# Patient Record
Sex: Male | Born: 1965 | Race: White | Marital: Married | Smoking: Former smoker
Health system: Southern US, Community
[De-identification: ages and names within clinical notes are randomized; demographics above are authoritative.]

## PROBLEM LIST (undated history)

## (undated) DIAGNOSIS — I10 Essential (primary) hypertension: Secondary | ICD-10-CM

## (undated) DIAGNOSIS — E782 Mixed hyperlipidemia: Secondary | ICD-10-CM

## (undated) DIAGNOSIS — T8859XA Other complications of anesthesia, initial encounter: Secondary | ICD-10-CM

## (undated) DIAGNOSIS — R112 Nausea with vomiting, unspecified: Secondary | ICD-10-CM

## (undated) DIAGNOSIS — M792 Neuralgia and neuritis, unspecified: Secondary | ICD-10-CM

## (undated) DIAGNOSIS — E785 Hyperlipidemia, unspecified: Secondary | ICD-10-CM

## (undated) DIAGNOSIS — Z9889 Other specified postprocedural states: Secondary | ICD-10-CM

## (undated) HISTORY — DX: Essential (primary) hypertension: I10

## (undated) HISTORY — DX: Hyperlipidemia, unspecified: E78.5

## (undated) HISTORY — PX: COLONOSCOPY: SHX174

## (undated) HISTORY — DX: Neuralgia and neuritis, unspecified: M79.2

## (undated) HISTORY — DX: Mixed hyperlipidemia: E78.2

## (undated) HISTORY — PX: OTHER SURGICAL HISTORY: SHX169

---

## 1992-01-23 HISTORY — PX: NOSE SURGERY: SHX723

## 2013-10-22 DIAGNOSIS — N5201 Erectile dysfunction due to arterial insufficiency: Secondary | ICD-10-CM | POA: Insufficient documentation

## 2013-10-22 DIAGNOSIS — Z8042 Family history of malignant neoplasm of prostate: Secondary | ICD-10-CM | POA: Insufficient documentation

## 2013-10-23 DIAGNOSIS — I491 Atrial premature depolarization: Secondary | ICD-10-CM | POA: Insufficient documentation

## 2013-10-23 DIAGNOSIS — E7801 Familial hypercholesterolemia: Secondary | ICD-10-CM | POA: Insufficient documentation

## 2016-10-30 ENCOUNTER — Telehealth: Payer: Self-pay | Admitting: *Deleted

## 2016-10-30 NOTE — Telephone Encounter (Signed)
Received Medical records from [facility] for New Patient appt on 11/05/16; forwarded to provider/SLS 10/09

## 2016-11-05 ENCOUNTER — Ambulatory Visit: Payer: Self-pay | Admitting: Medical

## 2016-11-21 ENCOUNTER — Telehealth: Payer: Self-pay

## 2016-11-21 NOTE — Telephone Encounter (Signed)
Pre visit call completed 

## 2016-11-22 ENCOUNTER — Ambulatory Visit (INDEPENDENT_AMBULATORY_CARE_PROVIDER_SITE_OTHER): Payer: PRIVATE HEALTH INSURANCE | Admitting: Medical

## 2016-11-22 ENCOUNTER — Encounter: Payer: Self-pay | Admitting: Medical

## 2016-11-22 VITALS — BP 138/98 | HR 73 | Temp 98.2°F | Resp 16 | Ht 73.0 in | Wt 278.0 lb

## 2016-11-22 DIAGNOSIS — Z125 Encounter for screening for malignant neoplasm of prostate: Secondary | ICD-10-CM | POA: Diagnosis not present

## 2016-11-22 DIAGNOSIS — E785 Hyperlipidemia, unspecified: Secondary | ICD-10-CM

## 2016-11-22 DIAGNOSIS — I10 Essential (primary) hypertension: Secondary | ICD-10-CM

## 2016-11-22 LAB — COMPREHENSIVE METABOLIC PANEL
ALBUMIN: 4.6 g/dL (ref 3.5–5.2)
ALK PHOS: 53 U/L (ref 39–117)
ALT: 42 U/L (ref 0–53)
AST: 32 U/L (ref 0–37)
BUN: 22 mg/dL (ref 6–23)
CO2: 31 mEq/L (ref 19–32)
CREATININE: 0.91 mg/dL (ref 0.40–1.50)
Calcium: 9.5 mg/dL (ref 8.4–10.5)
Chloride: 100 mEq/L (ref 96–112)
GFR: 92.36 mL/min (ref >60.00)
GLUCOSE: 95 mg/dL (ref 70–99)
POTASSIUM: 4 meq/L (ref 3.5–5.1)
SODIUM: 138 meq/L (ref 135–145)
TOTAL PROTEIN: 6.7 g/dL (ref 6.0–8.3)
Total Bilirubin: 0.8 mg/dL (ref 0.2–1.2)

## 2016-11-22 LAB — LIPID PANEL
CHOLESTEROL: 251 mg/dL — AB (ref 0–200)
HDL: 37.4 mg/dL — ABNORMAL LOW (ref >39.00)
LDL Cholesterol: 185 mg/dL — ABNORMAL HIGH (ref 0–99)
NONHDL: 213.5
Total CHOL/HDL Ratio: 7
Triglycerides: 141 mg/dL (ref 0.0–149.0)
VLDL: 28.2 mg/dL (ref 0.0–40.0)

## 2016-11-22 LAB — CBC
HCT: 46.6 % (ref 39.0–52.0)
Hemoglobin: 16.1 g/dL (ref 13.0–17.0)
MCHC: 34.5 g/dL (ref 30.0–36.0)
MCV: 88.7 fl (ref 78.0–100.0)
Platelets: 236 10*3/uL (ref 150.0–400.0)
RBC: 5.25 Mil/uL (ref 4.22–5.81)
RDW: 12.8 % (ref 11.5–15.5)
WBC: 7 10*3/uL (ref 4.0–10.5)

## 2016-11-22 LAB — PSA: PSA: 2.24 ng/mL (ref 0.10–4.00)

## 2016-11-22 MED ORDER — HYDROCHLOROTHIAZIDE 25 MG PO TABS: 25.0000 mg | ORAL_TABLET | Freq: Every day | ORAL | 0 refills | Status: DC

## 2016-11-22 MED ORDER — LOSARTAN POTASSIUM 50 MG PO TABS: 50.0000 mg | ORAL_TABLET | Freq: Every day | ORAL | 0 refills | Status: DC

## 2016-11-22 NOTE — Patient Instructions (Addendum)
Your blood pressure is borderline high today.  Also at home your report some borderline BP readings as well.  I want you to continue your HCTZ at current dose.  But I am also going to add low-dose losartan to make sure BP is more tightly controlled.  I would continue to check your blood pressure at home 3 times a week.  Would like you to run closer to 130/80 on average.  For high cholesterol history we will repeat lipid panel today.  Also will get metabolic panel and CBC.  Will get screening PSA today due to your family history.  Follow-up in 3 months or as needed.  Flu vaccine at pharmacy

## 2016-11-22 NOTE — Progress Notes (Signed)
Subjective:    Patient ID: Tim Welch, male    DOB: 13-Apr-1965, 51 y.o.   MRN: [id number]  HPI  Pt in for new pt appointment.   Pt bp is borderline is mild high today and most of his readings are borderline. He is only on diuretic. Pt states he was on benicar in past and thought he was about to pass out. He stopped that but he never checked his bp that time. But during that time in past he had lost 20 lbs.  Pt has some ED and he uses revatio.  Working out 3 days a week now, self employed, coffee 2 cups a day, Some tea, low carb diet recently, avoids red meat and pork. Alcohol 3-5 ounces vodka Friday and Saturday.  Last colonoscopy 2017.  Last tetanus 2017-2018.   Dad prostate cancer at age 50 yo.    Review of Systems  Constitutional: Negative for chills, fatigue and fever.  HENT: Negative for congestion, ear discharge, facial swelling, postnasal drip and rhinorrhea.   Respiratory: Negative for cough, chest tightness, shortness of breath and wheezing.   Cardiovascular: Negative for chest pain and palpitations.  Gastrointestinal: Negative for abdominal pain, blood in stool, nausea and vomiting.  Musculoskeletal: Negative for back pain, gait problem and joint swelling.  Skin: Negative for rash.  Neurological: Negative for dizziness, seizures, speech difficulty, weakness, numbness and headaches.  Hematological: Negative for adenopathy. Does not bruise/bleed easily.  Psychiatric/Behavioral: Negative for behavioral problems, confusion, self-injury and sleep disturbance. The patient is not nervous/anxious.    Past Medical History:  Diagnosis Date  . Hyperlipidemia   . Hypertension      Social History   Social History  . Marital status: Married    Spouse name: N/A  . Number of children: N/A  . Years of education: N/A   Occupational History  . Not on file.   Social History Main Topics  . Smoking status: Former Games developer  . Smokeless tobacco: Never Used  . Alcohol use Yes  .  Drug use: Unknown  . Sexual activity: Not on file   Other Topics Concern  . Not on file   Social History Narrative  . No narrative on file    Past Surgical History:  Procedure Laterality Date  . tosillectomy      Family History  Problem Relation Age of Onset  . Cancer Mother   . Hypertension Father   . Hyperlipidemia Father   . Cancer Father     Allergies  Allergen Reactions  . Penicillins Hives    Current Outpatient Prescriptions on File Prior to Visit  Medication Sig Dispense Refill  . hydrochlorothiazide (HYDRODIURIL) 25 MG tablet Take 25 mg by mouth daily.    . sildenafil (REVATIO) 20 MG tablet Take 20 mg by mouth 3 (three) times daily.     No current facility-administered medications on file prior to visit.     Pulse 73   Temp 98.2 F (36.8 C) (Oral)   Resp 16   Ht 6\' 1"  (1.854 m)   Wt 278 lb (126.1 kg)   SpO2 98%   BMI 36.68 kg/m       Objective:   Physical Exam  General Mental Status- Alert. General Appearance- Not in acute distress.   Skin General: Color- Normal Color. Moisture- Normal Moisture.  Neck Carotid Arteries- Normal color. Moisture- Normal Moisture. No carotid bruits. No JVD.  Chest and Lung Exam Auscultation: Breath Sounds:-Normal.  Cardiovascular Auscultation:Rythm- Regular. Murmurs & Other Heart Sounds:Auscultation  of the heart reveals- No Murmurs.  Abdomen Inspection:-Inspeection Normal. Palpation/Percussion:Note:No mass. Palpation and Percussion of the abdomen reveal- Non Tender, Non Distended + BS, no rebound or guarding.   Neurologic Cranial Nerve exam:- CN III-XII intact(No nystagmus), symmetric smile. Normal/IntactStrength:- 5/5 equal and symmetric strength both upper and lower extremities.      Assessment & Plan:  Your blood pressure is borderline high today.  Also at home your report some borderline BP readings as well.  I want you to continue your HCTZ at current dose.  But I am also going to add low-dose  losartan to make sure BP is more tightly controlled.  I would continue to check your blood pressure at home 3 times a week.  Would like you to run closer to 130/80 on average.  For high cholesterol history we will repeat lipid panel today.  Also will get metabolic panel and CBC.  Will get screening PSA today due to your family history.  Follow-up in 3 months or as needed.  Coumba Kellison, Ramon DredgeEdward, PA-C

## 2016-11-23 ENCOUNTER — Telehealth: Payer: Self-pay | Admitting: Medical

## 2016-11-23 ENCOUNTER — Ambulatory Visit: Payer: Self-pay | Admitting: Medical

## 2016-11-23 MED ORDER — ATORVASTATIN CALCIUM 10 MG PO TABS: 10.0000 mg | ORAL_TABLET | Freq: Every day | ORAL | 2 refills | Status: DC

## 2016-11-23 NOTE — Telephone Encounter (Signed)
Prescription of atorvastatin sent to the pharmacy.

## 2017-01-28 ENCOUNTER — Other Ambulatory Visit: Payer: Self-pay | Admitting: Medical

## 2017-03-06 ENCOUNTER — Other Ambulatory Visit: Payer: Self-pay | Admitting: Medical

## 2017-03-06 NOTE — Telephone Encounter (Signed)
Called pt and LVM advising to call and schedule a follow up appt for further refills.

## 2017-03-06 NOTE — Telephone Encounter (Signed)
Pt due for follow up please call and schedule appointment.  

## 2017-06-25 ENCOUNTER — Ambulatory Visit (INDEPENDENT_AMBULATORY_CARE_PROVIDER_SITE_OTHER): Payer: PRIVATE HEALTH INSURANCE | Admitting: Medical

## 2017-06-25 ENCOUNTER — Telehealth: Payer: Self-pay | Admitting: Medical

## 2017-06-25 ENCOUNTER — Encounter: Payer: Self-pay | Admitting: Medical

## 2017-06-25 ENCOUNTER — Telehealth: Payer: Self-pay

## 2017-06-25 VITALS — BP 142/69 | HR 73 | Temp 98.2°F | Resp 16 | Ht 73.0 in | Wt 280.6 lb

## 2017-06-25 DIAGNOSIS — Z23 Encounter for immunization: Secondary | ICD-10-CM

## 2017-06-25 DIAGNOSIS — Z125 Encounter for screening for malignant neoplasm of prostate: Secondary | ICD-10-CM

## 2017-06-25 DIAGNOSIS — Z86010 Personal history of colonic polyps: Secondary | ICD-10-CM

## 2017-06-25 DIAGNOSIS — Z0000 Encounter for general adult medical examination without abnormal findings: Secondary | ICD-10-CM

## 2017-06-25 LAB — COMPREHENSIVE METABOLIC PANEL
ALK PHOS: 59 U/L (ref 39–117)
ALT: 48 U/L (ref 0–53)
AST: 38 U/L — AB (ref 0–37)
Albumin: 4.8 g/dL (ref 3.5–5.2)
BILIRUBIN TOTAL: 0.9 mg/dL (ref 0.2–1.2)
BUN: 21 mg/dL (ref 6–23)
CALCIUM: 9.7 mg/dL (ref 8.4–10.5)
CO2: 29 meq/L (ref 19–32)
CREATININE: 1.07 mg/dL (ref 0.40–1.50)
Chloride: 101 mEq/L (ref 96–112)
GFR: 76.45 mL/min (ref >60.00)
Glucose, Bld: 97 mg/dL (ref 70–99)
Potassium: 4.2 mEq/L (ref 3.5–5.1)
Sodium: 139 mEq/L (ref 135–145)
TOTAL PROTEIN: 6.7 g/dL (ref 6.0–8.3)

## 2017-06-25 LAB — CBC
HCT: 45.7 % (ref 39.0–52.0)
Hemoglobin: 16 g/dL (ref 13.0–17.0)
MCHC: 34.9 g/dL (ref 30.0–36.0)
MCV: 87.6 fl (ref 78.0–100.0)
Platelets: 258 10*3/uL (ref 150.0–400.0)
RBC: 5.22 Mil/uL (ref 4.22–5.81)
RDW: 13.3 % (ref 11.5–15.5)
WBC: 7.6 10*3/uL (ref 4.0–10.5)

## 2017-06-25 LAB — URINALYSIS, ROUTINE W REFLEX MICROSCOPIC
Bilirubin Urine: NEGATIVE
KETONES UR: NEGATIVE
Leukocytes, UA: NEGATIVE
Nitrite: NEGATIVE
PH: 5 (ref 5.0–8.0)
RBC / HPF: NONE SEEN (ref 0–?)
SPECIFIC GRAVITY, URINE: 1.025 (ref 1.000–1.030)
Total Protein, Urine: NEGATIVE
UROBILINOGEN UA: 0.2 (ref 0.0–1.0)
Urine Glucose: NEGATIVE
WBC, UA: NONE SEEN (ref 0–?)

## 2017-06-25 LAB — PSA: PSA: 2.15 ng/mL (ref 0.10–4.00)

## 2017-06-25 LAB — LIPID PANEL
CHOL/HDL RATIO: 6
Cholesterol: 172 mg/dL (ref 0–200)
HDL: 30.7 mg/dL — ABNORMAL LOW (ref >39.00)
LDL Cholesterol: 105 mg/dL — ABNORMAL HIGH (ref 0–99)
NONHDL: 141.42
Triglycerides: 183 mg/dL — ABNORMAL HIGH (ref 0.0–149.0)
VLDL: 36.6 mg/dL (ref 0.0–40.0)

## 2017-06-25 MED ORDER — ATORVASTATIN CALCIUM 10 MG PO TABS: ORAL_TABLET | ORAL | 2 refills | Status: DC

## 2017-06-25 MED ORDER — LOSARTAN POTASSIUM 50 MG PO TABS: 50.0000 mg | ORAL_TABLET | Freq: Every day | ORAL | 0 refills | Status: DC

## 2017-06-25 MED ORDER — CIPROFLOXACIN HCL 500 MG PO TABS: 500.0000 mg | ORAL_TABLET | Freq: Two times a day (BID) | ORAL | 0 refills | Status: DC

## 2017-06-25 MED ORDER — TYPHOID VACCINE PO CPDR: 1.0000 | DELAYED_RELEASE_CAPSULE | ORAL | 0 refills | Status: DC

## 2017-06-25 MED ORDER — SILDENAFIL CITRATE 20 MG PO TABS: ORAL_TABLET | ORAL | 0 refills | Status: AC

## 2017-06-25 MED ORDER — HYDROCHLOROTHIAZIDE 25 MG PO TABS: 25.0000 mg | ORAL_TABLET | Freq: Every day | ORAL | 0 refills | Status: DC

## 2017-06-25 NOTE — Patient Instructions (Addendum)
For you wellness exam today I have ordered cbc, cmp, tsh, lipid panel, and ua. PSA screening as well.  Hep A vaccine today. You can check on MMR and can update [place] as well.([facility] and update [place])  Recommend exercise and healthy diet.  We will let you know lab results as they come in.  Follow up date appointment will be determined after lab review.     Vivotif vaccine and cipro for [place] trip. Take as advised.  Preventive Care 40-64 Years, Male Preventive care refers to lifestyle choices and visits with your health care provider that can promote health and wellness. What does preventive care include?  A yearly physical exam. This is also called an annual well check.  Dental exams once or twice a year.  Routine eye exams. Ask your health care provider how often you should have your eyes checked.  Personal lifestyle choices, including: ? Daily care of your teeth and gums. ? Regular physical activity. ? Eating a healthy diet. ? Avoiding tobacco and drug use. ? Limiting alcohol use. ? Practicing safe sex. ? Taking low-dose aspirin every day starting at age 39. What happens during an annual well check? The services and screenings done by your health care provider during your annual well check will depend on your age, overall health, lifestyle risk factors, and family history of disease. Counseling Your health care provider may ask you questions about your:  Alcohol use.  Tobacco use.  Drug use.  Emotional well-being.  Home and relationship well-being.  Sexual activity.  Eating habits.  Work and work Statistician.  Screening You may have the following tests or measurements:  Height, weight, and BMI.  Blood pressure.  Lipid and cholesterol levels. These may be checked every 5 years, or more frequently if you are over 84 years old.  Skin check.  Lung cancer screening. You may have this screening every year starting at age 75 if you have a 30-pack-year  history of smoking and currently smoke or have quit within the past 15 years.  Fecal occult blood test (FOBT) of the stool. You may have this test every year starting at age 37.  Flexible sigmoidoscopy or colonoscopy. You may have a sigmoidoscopy every 5 years or a colonoscopy every 10 years starting at age 39.  Prostate cancer screening. Recommendations will vary depending on your family history and other risks.  Hepatitis C blood test.  Hepatitis B blood test.  Sexually transmitted disease (STD) testing.  Diabetes screening. This is done by checking your blood sugar (glucose) after you have not eaten for a while (fasting). You may have this done every 1-3 years.  Discuss your test results, treatment options, and if necessary, the need for more tests with your health care provider. Vaccines Your health care provider may recommend certain vaccines, such as:  Influenza vaccine. This is recommended every year.  Tetanus, diphtheria, and acellular pertussis (Tdap, Td) vaccine. You may need a Td booster every 10 years.  Varicella vaccine. You may need this if you have not been vaccinated.  Zoster vaccine. You may need this after age 26.  Measles, mumps, and rubella (MMR) vaccine. You may need at least one dose of MMR if you were born in 1957 or later. You may also need a second dose.  Pneumococcal 13-valent conjugate (PCV13) vaccine. You may need this if you have certain conditions and have not been vaccinated.  Pneumococcal polysaccharide (PPSV23) vaccine. You may need one or two doses if you smoke  cigarettes or if you have certain conditions.  Meningococcal vaccine. You may need this if you have certain conditions.  Hepatitis A vaccine. You may need this if you have certain conditions or if you travel or work in places where you may be exposed to hepatitis A.  Hepatitis B vaccine. You may need this if you have certain conditions or if you travel or work in places where you may be  exposed to hepatitis B.  Haemophilus influenzae type b (Hib) vaccine. You may need this if you have certain risk factors.  Talk to your health care provider about which screenings and vaccines you need and how often you need them. This information is not intended to replace advice given to you by your health care provider. Make sure you discuss any questions you have with your health care provider. Document Released: 02/04/2015 Document Revised: 09/28/2015 Document Reviewed: 11/09/2014 Elsevier Interactive Patient Education  [employer].

## 2017-06-25 NOTE — Telephone Encounter (Deleted)
Copied from CRM [id number]. Topic: Medical Record Request - Provider/Facility Request >> Jun 25, 2017 10:16 AM Diana EvesHoyt, Maryann B wrote: Patient Name/DOB/MRN #: [id number]  Pt contacted [facility] and was told it would be easier for the PCP to get [facility]. They need to review the last one before they can schedule a future colonoscopy.

## 2017-06-25 NOTE — Telephone Encounter (Signed)
Hep A is series. You will get partial protection from vaccine given today. Pass message to patient.

## 2017-06-25 NOTE — Telephone Encounter (Signed)
Copied from CRM [id number]. Topic: Quick Communication - See Telephone Encounter >> Jun 25, 2017 10:04 AM Diana EvesHoyt, Maryann B wrote: CRM for notification. See Telephone encounter for: 06/25/17.  Pt calling in having some questions about the hep A vaccine. He is suppose to come back in Dec and get the second part of the shot. He is wanting to know if this is a booster or if this is needed for the vaccine to be affective. He is going to [place] before Dec. Please advise.

## 2017-06-25 NOTE — Addendum Note (Signed)
Addended by: Gwenevere AbbotSAGUIER, Tameka Hoiland M on: 06/25/2017 11:57 AM   Modules accepted: Level of Service

## 2017-06-25 NOTE — Progress Notes (Signed)
Subjective:    Patient ID: Tim Welch, male    DOB: 1965/09/08, 52 y.o.   MRN: [id number]  HPI  Pt in for wellness exam.  He is fasting.  Pt has been exercising 2 days a week. Pt admits not eating healthy recently. Purposeful weight loss of 8 pounds.   Will travel to [place] in October. Request vivotif vaccine and cipro in event gets travelers diarrha.   Review of Systems  Constitutional: Negative for chills, fatigue and fever.  HENT: Negative for congestion, ear pain, mouth sores, postnasal drip, sinus pressure and sinus pain.   Respiratory: Negative for cough, chest tightness, shortness of breath and wheezing.   Cardiovascular: Negative for chest pain and palpitations.  Gastrointestinal: Negative for abdominal distention, abdominal pain, blood in stool, constipation, rectal pain and vomiting.  Genitourinary: Negative for decreased urine volume, difficulty urinating, discharge, dysuria, frequency, hematuria, penile swelling and testicular pain.       Slight hesitant urine flow. At times but not constant.  Musculoskeletal: Negative for back pain, joint swelling and neck pain.  Skin: Negative for pallor and rash.  Neurological: Negative for dizziness, syncope, weakness, numbness and headaches.  Hematological: Negative for adenopathy. Does not bruise/bleed easily.  Psychiatric/Behavioral: Negative for behavioral problems, confusion, dysphoric mood, self-injury and suicidal ideas. The patient is not nervous/anxious.     Past Medical History:  Diagnosis Date  . Hyperlipidemia   . Hypertension      Social History   Socioeconomic History  . Marital status: Married    Spouse name: Not on file  . Number of children: Not on file  . Years of education: Not on file  . Highest education level: Not on file  Occupational History  . Not on file  Social Needs  . Financial resource strain: Not on file  . Food insecurity:    Worry: Not on file    Inability: Not on file  .  Transportation needs:    Medical: Not on file    Non-medical: Not on file  Tobacco Use  . Smoking status: Former Research scientist (life sciences)  . Smokeless tobacco: Never Used  Substance and Sexual Activity  . Alcohol use: Yes  . Drug use: No  . Sexual activity: Yes  Lifestyle  . Physical activity:    Days per week: Not on file    Minutes per session: Not on file  . Stress: Not on file  Relationships  . Social connections:    Talks on phone: Not on file    Gets together: Not on file    Attends religious service: Not on file    Active member of club or organization: Not on file    Attends meetings of clubs or organizations: Not on file    Relationship status: Not on file  . Intimate partner violence:    Fear of current or ex partner: Not on file    Emotionally abused: Not on file    Physically abused: Not on file    Forced sexual activity: Not on file  Other Topics Concern  . Not on file  Social History Narrative  . Not on file    Past Surgical History:  Procedure Laterality Date  . tosillectomy      Family History  Problem Relation Age of Onset  . Cancer Mother   . Hypertension Father   . Hyperlipidemia Father   . Cancer Father     Allergies  Allergen Reactions  . Penicillins Hives    No current outpatient  medications on file prior to visit.   No current facility-administered medications on file prior to visit.     BP (!) 142/69   Pulse 73   Temp 98.2 F (36.8 C) (Oral)   Resp 16   Ht '6\' 1"'$  (1.854 m)   Wt 280 lb 9.6 oz (127.3 kg)   SpO2 97%   BMI 37.02 kg/m       Objective:   Physical Exam  General Mental Status- Alert. General Appearance- Not in acute distress.   Skin General: Color- Normal Color. Moisture- Normal Moisture.  Neck Carotid Arteries- Normal color. Moisture- Normal Moisture. No carotid bruits. No JVD.  Chest and Lung Exam Auscultation: Breath Sounds:-Normal.  Cardiovascular Auscultation:Rythm- Regular. Murmurs & Other Heart  Sounds:Auscultation of the heart reveals- No Murmurs.  Abdomen Inspection:-Inspeection Normal. Palpation/Percussion:Note:No mass. Palpation and Percussion of the abdomen reveal- Non Tender, Non Distended + BS, no rebound or guarding.    Neurologic Cranial Nerve exam:- CN III-XII intact(No nystagmus), symmetric smile. Strength:- 5/5 equal and symmetric strength both upper and lower extremities.      Assessment & Plan:  For you wellness exam today I have ordered cbc, cmp, tsh, lipid panel, and ua. PSA screening as well.  Hep A vaccine today. You can check on MMR and can update [place] as well.([facility] and [facility] [place])  Recommend exercise and healthy diet.  We will let you know lab results as they come in.  Follow up date appointment will be determined after lab review.     Vivotif vaccine and cipro for [place] trip. Take as advised.  Mackie Pai, PA-C

## 2017-06-27 NOTE — Telephone Encounter (Signed)
Notified pt. 

## 2017-07-05 NOTE — Telephone Encounter (Signed)
Error

## 2017-07-29 ENCOUNTER — Telehealth: Payer: Self-pay

## 2017-07-29 NOTE — Telephone Encounter (Signed)
I had to look at prior messages as you last message sent to me did not go through?  So is this regarding his hep A vaccine effectiveness.   I did some research on varied retrospective studies with just one dose of hep A vaccine and it showed good seroconversion/immunity in about 78% of  Healthy adults about 2 weeks post single dose.(study in  infectious disease society of [place] journal)  So it is not 100% effective but he is fairly well covered in light of his timing of travel.  Getting second dose at 6 month interval still recommended.

## 2017-08-23 ENCOUNTER — Other Ambulatory Visit: Payer: Self-pay | Admitting: Medical

## 2017-08-27 ENCOUNTER — Telehealth: Payer: Self-pay

## 2017-08-27 NOTE — Telephone Encounter (Signed)
[facility] on 08/23/17 Received records 08/27/17 - sending interoffice to GI  LM

## 2017-09-24 ENCOUNTER — Other Ambulatory Visit: Payer: Self-pay | Admitting: Medical

## 2017-10-28 ENCOUNTER — Encounter: Payer: Self-pay | Admitting: Gastroenterology

## 2017-10-28 NOTE — Progress Notes (Signed)
Reviewed paperwork that will be scanned into the system. Looks like he had a colonoscopy in 2015. The actual report is not present but it is written that the patient has a family history of colon polyps and also had an inadequate preparation. He had a polyp at 30 cm that measured 2x1x.5 cm in size that returned as TA with HGD (focal), and a polyp at 10 cm that was hyperplastic and a polyp at 5 cm that was hyperplastic.   The plan per written report on Pathology report was for a flexible sigmoidoscopy in 2/16 and then a full colonoscopy in 8/16. No documentation or records are present of any further procedures. I will have th records submitted for documentation. I will ask if staff can see if they can find any other procedure notes. He is scheduled for a colonoscopy in 10/19 with me, which is reasonable for follow up purposes, most assuredly if he hasn't had any other procedures since 2015 or not since 2016.  Tim Parish, MD [facility] # [phone]

## 2017-10-29 NOTE — Progress Notes (Signed)
Dr Meridee Score, I spoke to the Medical Records Department at [facility]. They sent over a few more records for you to review. I placed them in your In Box side A at my desk.  Thanks. Tresa Endo

## 2017-10-30 NOTE — Progress Notes (Signed)
Thank you  for update

## 2017-11-03 NOTE — Progress Notes (Signed)
Further notes for review. 2015 colonoscopy. 3 polyps removed.  Polyp #1 was approximately 4 cm x 3 cm and was at 30 cm.  Polyp #2 was 8 mm and was at 10 cm.  Polyp #3 was 8 mm and was at 5 cm Reported bowel preparation was in adequate.  Plan for repeat colonoscopy in 1 year. 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 Pathology consistent with benign colonic mucosa.  March 2017 sigmoidoscopy Flexible sigmoidoscopy showed normal colon and internal hemorrhoids.  Based on this information and documentation which we will have scanned into the chart patient is not due for a surveillance colonoscopy until 2020.  I will plan to have his colonoscopy rescheduled to the beginning of the new year.  Corliss Parish, MD [facility] # [phone]

## 2017-11-04 ENCOUNTER — Encounter: Payer: Self-pay | Admitting: Medical

## 2017-11-04 NOTE — Progress Notes (Signed)
Recall in Epic for 01/2018 colon

## 2017-12-03 ENCOUNTER — Other Ambulatory Visit: Payer: Self-pay | Admitting: Medical

## 2017-12-25 ENCOUNTER — Other Ambulatory Visit: Payer: Self-pay | Admitting: Medical

## 2017-12-31 ENCOUNTER — Telehealth: Payer: Self-pay | Admitting: Medical

## 2017-12-31 NOTE — Telephone Encounter (Signed)
Corliss ParishGabriel Mansouraty, MD [facility] # [phone]  Can you call patient and give him number of Dr. Nelson ChimesMonsouraty MD Gastroenterologist  so pt can call and schedule colonoscopy.  I saw that [facility].

## 2017-12-31 NOTE — Telephone Encounter (Signed)
Opened to review 

## 2018-01-02 NOTE — Telephone Encounter (Signed)
Left pt a message and gave GI number to call and schedule appointment.

## 2018-01-20 ENCOUNTER — Other Ambulatory Visit: Payer: Self-pay | Admitting: Medical

## 2018-03-21 ENCOUNTER — Encounter: Payer: Self-pay | Admitting: Gastroenterology

## 2018-04-18 ENCOUNTER — Other Ambulatory Visit: Payer: Self-pay | Admitting: Medical

## 2018-05-03 ENCOUNTER — Other Ambulatory Visit: Payer: Self-pay | Admitting: Medical

## 2018-05-05 NOTE — Telephone Encounter (Signed)
Pt due for follow up. Called and left pt a message to call and schedule virtual visit.

## 2018-05-06 ENCOUNTER — Encounter: Payer: Self-pay | Admitting: Medical

## 2018-05-06 ENCOUNTER — Ambulatory Visit (INDEPENDENT_AMBULATORY_CARE_PROVIDER_SITE_OTHER): Payer: PRIVATE HEALTH INSURANCE | Admitting: Medical

## 2018-05-06 ENCOUNTER — Other Ambulatory Visit: Payer: Self-pay

## 2018-05-06 DIAGNOSIS — J301 Allergic rhinitis due to pollen: Secondary | ICD-10-CM

## 2018-05-06 DIAGNOSIS — J0110 Acute frontal sinusitis, unspecified: Secondary | ICD-10-CM | POA: Diagnosis not present

## 2018-05-06 MED ORDER — PREDNISONE 10 MG (21) PO TBPK: ORAL_TABLET | ORAL | 0 refills | Status: DC

## 2018-05-06 MED ORDER — LEVOCETIRIZINE DIHYDROCHLORIDE 5 MG PO TABS: 5.0000 mg | ORAL_TABLET | Freq: Every evening | ORAL | 3 refills | Status: DC

## 2018-05-06 MED ORDER — CLINDAMYCIN HCL 150 MG PO CAPS: 150.0000 mg | ORAL_CAPSULE | Freq: Three times a day (TID) | ORAL | 0 refills | Status: DC

## 2018-05-06 MED ORDER — PREDNISONE 10 MG PO TABS: ORAL_TABLET | ORAL | 0 refills | Status: DC

## 2018-05-06 NOTE — Patient Instructions (Signed)
By description and history exam likely has persistent lingering allergic rhinitis with sinus infection.  Appears to have failed doxycycline antibiotic and other measures as described on history of present illness.  Will prescribe clindamycin antibiotic.  Rx advisement given.  Counseled to use probiotics while using clindamycin.  Also prescribed 6-day taper dose of prednisone and advised to continue Astelin.  In addition sent prescription of Xyzal to patient's pharmacy.  He has intermittent cough with above.  We had discussion regarding COVID type signs and symptoms.  I do not think his symptoms represent COVID.  Rather it appears he has persistent allergies.  If he has fever or worsening signs symptoms he will notify me.  Asked him to update me on Friday.  Considering a possibility of giving him steroid injection to help him recover from allergies.  Follow-up date to be determined.

## 2018-05-06 NOTE — Progress Notes (Signed)
Subjective:    Patient ID: Tim Welch, male    DOB: 1966-01-06, 53 y.o.   MRN: [id number]  HPI  Virtual Visit via Video Note  I connected with Tim Welch on 05/06/18 at  3:00 PM EDT by a video enabled telemedicine application and verified that I am speaking with the correct person using two identifiers.   I discussed the limitations of evaluation and management by telemedicine and the availability of in person appointments. The patient expressed understanding and agreed to proceed.   History of Present Illness:  Pt in for virtual visit. Since first week of February she had pnd  And now he has nasal congested and has been using netty pot. Pt still has sinus pressure.  On March 7  he got doxy and steroid pack. He states he got rid of chest congestion but went to head and got nasal congestion.  March 15 got 3 more days of doxy.  On April 1 he used [facility] and he was prescribed omnicef, vic and afrin.  Pt states in shower now still blowing out some colored mucus.  He is not having any fevers. No chills or sweats.   Pt is wearing a mask, working at home and self distancing.   Pt one month ago had brief exposure to someone who had later tested positive for covid.(was in pt house only for one hour and he did not have close contact). She stopped over for rest for only one hour. He kept his distance from her and wiped down the room.       Observations/Objective: No acute distress. When he presses on rt frontal sinus and ethmoid area states mild pressure/pain.  Assessment and Plan: By description and history exam likely has persistent lingering allergic rhinitis with sinus infection.  Appears to have failed doxycycline antibiotic and other measures as described on history of present illness.  Will prescribe clindamycin antibiotic.  Rx advisement given.  Counseled to use probiotics while using clindamycin.  Also prescribed 6-day taper dose of prednisone and advised to  continue Astelin.  In addition sent prescription of Xyzal to patient's pharmacy.  He has intermittent cough with above.  We had discussion regarding COVID type signs and symptoms.  I do not think his symptoms represent COVID.  Rather it appears he has persistent allergies.  If he has fever or worsening signs symptoms he will notify me.  Asked him to update me on Friday.  Considering a possibility of giving him steroid injection to help him recover from allergies.  Follow-up date to be determined.  Follow Up Instructions:    I discussed the assessment and treatment plan with the patient. The patient was provided an opportunity to ask questions and all were answered. The patient agreed with the plan and demonstrated an understanding of the instructions.   The patient was advised to call back or seek an in-person evaluation if the symptoms worsen or if the condition fails to improve as anticipated.  I provided 25  minutes of non-face-to-face time during this encounter.   Esperanza RichtersEdward Wilbert Schouten, PA-C    Review of Systems  Constitutional: Negative for chills, fatigue and fever.  HENT: Positive for congestion, postnasal drip, sinus pressure, sinus pain and sneezing.   Respiratory: Positive for cough. Negative for shortness of breath.   Cardiovascular: Negative for chest pain and palpitations.  Gastrointestinal: Negative for abdominal pain.  Musculoskeletal: Negative for myalgias.  Neurological: Negative for dizziness, syncope, weakness and headaches.  Hematological: Negative for  adenopathy. Does not bruise/bleed easily.  Psychiatric/Behavioral: Negative for behavioral problems and confusion.   Past Medical History:  Diagnosis Date  . Hyperlipidemia   . Hypertension      Social History   Socioeconomic History  . Marital status: Married    Spouse name: Not on file  . Number of children: Not on file  . Years of education: Not on file  . Highest education level: Not on file  Occupational  History  . Not on file  Social Needs  . Financial resource strain: Not on file  . Food insecurity:    Worry: Not on file    Inability: Not on file  . Transportation needs:    Medical: Not on file    Non-medical: Not on file  Tobacco Use  . Smoking status: Former Games developer  . Smokeless tobacco: Never Used  Substance and Sexual Activity  . Alcohol use: Yes  . Drug use: No  . Sexual activity: Yes  Lifestyle  . Physical activity:    Days per week: Not on file    Minutes per session: Not on file  . Stress: Not on file  Relationships  . Social connections:    Talks on phone: Not on file    Gets together: Not on file    Attends religious service: Not on file    Active member of club or organization: Not on file    Attends meetings of clubs or organizations: Not on file    Relationship status: Not on file  . Intimate partner violence:    Fear of current or ex partner: Not on file    Emotionally abused: Not on file    Physically abused: Not on file    Forced sexual activity: Not on file  Other Topics Concern  . Not on file  Social History Narrative  . Not on file    Past Surgical History:  Procedure Laterality Date  . tosillectomy      Family History  Problem Relation Age of Onset  . Cancer Mother   . Hypertension Father   . Hyperlipidemia Father   . Cancer Father     Allergies  Allergen Reactions  . Penicillins Hives    Current Outpatient Medications on File Prior to Visit  Medication Sig Dispense Refill  . atorvastatin (LIPITOR) 10 MG tablet Take 1 tablet (10 mg total) by mouth daily. 30 tablet 0  . hydrochlorothiazide (HYDRODIURIL) 25 MG tablet TAKE 1 TABLET BY MOUTH EVERY DAY 90 tablet 0  . losartan (COZAAR) 50 MG tablet TAKE 1 TABLET BY MOUTH EVERY DAY 90 tablet 0  . sildenafil (REVATIO) 20 MG tablet 1 tab as needed prior to sex 30 tablet 0  . typhoid (VIVOTIF) DR capsule Take 1 capsule by mouth every other day. 4 capsule 0   No current  facility-administered medications on file prior to visit.     There were no vitals taken for this visit.      Objective:   Physical Exam See objective.       Assessment & Plan:

## 2018-05-12 ENCOUNTER — Other Ambulatory Visit: Payer: Self-pay

## 2018-05-12 ENCOUNTER — Other Ambulatory Visit: Payer: Self-pay | Admitting: Medical

## 2018-05-12 MED ORDER — LOSARTAN POTASSIUM 50 MG PO TABS: 50.0000 mg | ORAL_TABLET | Freq: Every day | ORAL | 0 refills | Status: DC

## 2018-06-10 ENCOUNTER — Other Ambulatory Visit: Payer: Self-pay | Admitting: Medical

## 2018-07-15 ENCOUNTER — Other Ambulatory Visit: Payer: Self-pay | Admitting: Medical

## 2018-07-16 NOTE — Telephone Encounter (Signed)
Left pt a message to call back and schedule follow up.

## 2018-07-21 ENCOUNTER — Other Ambulatory Visit: Payer: Self-pay | Admitting: Medical

## 2018-10-27 ENCOUNTER — Other Ambulatory Visit: Payer: Self-pay | Admitting: Medical

## 2018-11-19 ENCOUNTER — Other Ambulatory Visit: Payer: Self-pay | Admitting: Medical

## 2018-11-19 NOTE — Telephone Encounter (Signed)
Pt is due for follow up

## 2018-11-19 NOTE — Telephone Encounter (Signed)
LM for pt to call back and schedule office visit

## 2018-11-20 ENCOUNTER — Other Ambulatory Visit: Payer: Self-pay | Admitting: Medical

## 2018-11-20 NOTE — Telephone Encounter (Signed)
Pt is due for follow up

## 2019-01-30 ENCOUNTER — Encounter (INDEPENDENT_AMBULATORY_CARE_PROVIDER_SITE_OTHER): Payer: Self-pay | Admitting: Otolaryngology

## 2019-01-30 ENCOUNTER — Other Ambulatory Visit: Payer: Self-pay

## 2019-01-30 ENCOUNTER — Ambulatory Visit (INDEPENDENT_AMBULATORY_CARE_PROVIDER_SITE_OTHER): Payer: PRIVATE HEALTH INSURANCE | Admitting: Otolaryngology

## 2019-01-30 VITALS — Temp 96.6°F

## 2019-01-30 DIAGNOSIS — R6884 Jaw pain: Secondary | ICD-10-CM | POA: Diagnosis not present

## 2019-01-30 NOTE — Progress Notes (Signed)
HPI: Tim Welch is a 54 y.o. male who presents is referred by his PCP for evaluation of left jaw pain.  This initially began back in May or June.  He saw his dentist who didn't find any abnormalities to explain the discomfort.  He describes discomfort sometimes when he talks and describes pain that begins back toward the tonsil area and radiates up into the left side of the jaw.  It is exacerbated by talking.  He doesn't really have trouble swallowing or eating.  The symptoms come and go.  After you're seeing his dentist in June it essentially resolved but then he had recurrent symptoms a couple months later on.  Again they got better but then returned recently over the past month.  He denies any trauma in this region.  He had his tonsils removed when he was 37. He has tried to look himself and cannot find anything abnormal or filling thing abnormal himself. He is referred here for further evaluation.  Past Medical History:  Diagnosis Date  . Hyperlipidemia   . Hypertension    Past Surgical History:  Procedure Laterality Date  . tosillectomy     Social History   Socioeconomic History  . Marital status: Married    Spouse name: Not on file  . Number of children: Not on file  . Years of education: Not on file  . Highest education level: Not on file  Occupational History  . Not on file  Tobacco Use  . Smoking status: Former Smoker    Packs/day: 3.00    Years: 25.00    Pack years: 75.00    Start date: 9    Quit date: 2008    Years since quitting: 13.0  . Smokeless tobacco: Never Used  Substance and Sexual Activity  . Alcohol use: Yes  . Drug use: No  . Sexual activity: Yes  Other Topics Concern  . Not on file  Social History Narrative  . Not on file   Social Determinants of Health   Financial Resource Strain:   . Difficulty of Paying Living Expenses: Not on file  Food Insecurity:   . Worried About Charity fundraiser in the Last Year: Not on file  . Ran Out of Food in the  Last Year: Not on file  Transportation Needs:   . Lack of Transportation (Medical): Not on file  . Lack of Transportation (Non-Medical): Not on file  Physical Activity:   . Days of Exercise per Week: Not on file  . Minutes of Exercise per Session: Not on file  Stress:   . Feeling of Stress : Not on file  Social Connections:   . Frequency of Communication with Friends and Family: Not on file  . Frequency of Social Gatherings with Friends and Family: Not on file  . Attends Religious Services: Not on file  . Active Member of Clubs or Organizations: Not on file  . Attends Archivist Meetings: Not on file  . Marital Status: Not on file   Family History  Problem Relation Age of Onset  . Cancer Mother   . Hypertension Father   . Hyperlipidemia Father   . Cancer Father    Allergies  Allergen Reactions  . Penicillins Hives   Prior to Admission medications   Medication Sig Start Date End Date Taking? Authorizing Provider  atorvastatin (LIPITOR) 10 MG tablet Take 1 tablet (10 mg total) by mouth daily. 06/10/18  Yes Saguier, Percell Miller, PA-C  clindamycin (CLEOCIN) 150 MG  capsule Take 1 capsule (150 mg total) by mouth 3 (three) times daily. 05/06/18  Yes Saguier, Ramon Dredge, PA-C  hydrochlorothiazide (HYDRODIURIL) 25 MG tablet TAKE 1 TABLET BY MOUTH EVERY DAY 11/20/18  Yes Saguier, Ramon Dredge, PA-C  levocetirizine (XYZAL) 5 MG tablet Take 1 tablet (5 mg total) by mouth every evening. 05/06/18  Yes Saguier, Ramon Dredge, PA-C  losartan (COZAAR) 50 MG tablet Take 1 tablet (50 mg total) by mouth daily. 05/12/18  Yes Saguier, Ramon Dredge, PA-C  losartan (COZAAR) 50 MG tablet TAKE 1 TABLET BY MOUTH EVERY DAY 11/19/18  Yes Saguier, Ramon Dredge, PA-C  predniSONE (STERAPRED UNI-PAK 21 TAB) 10 MG (21) TBPK tablet Taper over 6 days 05/06/18  Yes Saguier, Ramon Dredge, PA-C  sildenafil (REVATIO) 20 MG tablet 1 tab as needed prior to sex 06/25/17  Yes Saguier, Ramon Dredge, PA-C  typhoid (VIVOTIF) DR capsule Take 1 capsule by mouth every  other day. 06/25/17  Yes Saguier, Ramon Dredge, PA-C     Positive ROS: Otherwise negative  All other systems have been reviewed and were otherwise negative with the exception of those mentioned in the HPI and as above.  Physical Exam: Constitutional: Alert, well-appearing, no acute distress Ears: External ears without lesions or tenderness. Ear canals are clear bilaterally with intact, clear TMs.  Nasal: External nose without lesions. Septum with mild deformity.  Mild rhinitis.. Clear nasal passages.  No signs of infection  Oral: Lips and gums without lesions. Tongue and palate mucosa without lesions. Posterior oropharynx clear.  Patient is status post tonsillectomy.  Left retromolar trigone region is clear with no mucosal abnormalities noted.  Floor of mouth left lateral tongue are normal to evaluation.  Buccal mucosa normal to evaluation. Indirect laryngoscopy revealed clear base of tongue.  Lingual tonsil region was benign in appearance.  Hypopharyngeal mucosal wall was clear.  Epiglottis and vocal cords were clear.  Piriform sinus was clear. Palpation of the tonsillar region and left posterior tongue and tongue base was all normal and soft to palpation.  No palpable elongated styloid process.  No palpable nodules. Neck: No palpable adenopathy or masses. Respiratory: Breathing comfortably  Skin: No facial/neck lesions or rash noted.  Procedures  Assessment: Left tongue and throat pain questionable etiology.  Normal upper airway examination with no evidence of neoplasm. 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 If symptoms worsen or change he will follow-up as needed. Reassured him of normal exam in the office with no evidence of neoplasm.   Narda Bonds, MD   CC:

## 2019-02-19 ENCOUNTER — Other Ambulatory Visit: Payer: Self-pay | Admitting: Medical

## 2019-06-30 ENCOUNTER — Other Ambulatory Visit: Payer: Self-pay | Admitting: Physician Assistant

## 2019-09-14 ENCOUNTER — Ambulatory Visit: Payer: PRIVATE HEALTH INSURANCE | Admitting: Physician Assistant

## 2019-09-14 ENCOUNTER — Other Ambulatory Visit: Payer: Self-pay

## 2019-09-14 ENCOUNTER — Encounter: Payer: Self-pay | Admitting: Physician Assistant

## 2019-09-14 VITALS — BP 178/94 | HR 82 | Temp 97.9°F | Ht 73.0 in | Wt 271.0 lb

## 2019-09-14 DIAGNOSIS — R6884 Jaw pain: Secondary | ICD-10-CM | POA: Insufficient documentation

## 2019-09-14 DIAGNOSIS — E782 Mixed hyperlipidemia: Secondary | ICD-10-CM | POA: Diagnosis not present

## 2019-09-14 DIAGNOSIS — I10 Essential (primary) hypertension: Secondary | ICD-10-CM | POA: Diagnosis not present

## 2019-09-14 MED ORDER — HYDROCHLOROTHIAZIDE 25 MG PO TABS: 25.0000 mg | ORAL_TABLET | Freq: Every day | ORAL | 1 refills | Status: DC

## 2019-09-14 MED ORDER — ATORVASTATIN CALCIUM 10 MG PO TABS: 10.0000 mg | ORAL_TABLET | Freq: Every day | ORAL | 1 refills | Status: DC

## 2019-09-14 MED ORDER — LOSARTAN POTASSIUM 50 MG PO TABS: 50.0000 mg | ORAL_TABLET | Freq: Every day | ORAL | 1 refills | Status: DC

## 2019-09-14 NOTE — Assessment & Plan Note (Signed)
LABWORK PENDING RESTART MEDICATION AS DIRECTED

## 2019-09-14 NOTE — Assessment & Plan Note (Signed)
RESTART MEDICATIONS AS DIRECTED RECHECK BP IN ONE MONTH LABWORK PENDING

## 2019-09-14 NOTE — Assessment & Plan Note (Signed)
PT HAS SEEN ENT --- AT THIS POINT RECOMMEND IMAGING OF JAW FOR FURTHER ASSESSMENT

## 2019-09-14 NOTE — Progress Notes (Signed)
Established Patient Office Visit  Subjective:  Patient ID: Tim Welch, male    DOB: 10-07-1965  Age: 54 y.o. MRN: [id number]  CC:  Chief Complaint  Patient presents with  . Hypertension    Medication follow up    HPI Tim Welch presents for follow up hypertension   Pt presents for follow up of hypertension.  Pt ran out of medication over 3 weeks ago and noted bp has elevated - while he was on med he was doing well - he denies chest pain/sob/edema    Pt presents with hyperlipidemia.  Pt has been out of medication for several weeks - is due for labwork Was taking lipitor 10mg  qd  Pt continues to have jaw/mouth pain - states that he has had trouble for several months/year - he had seen ENT and had thorough office exam and nothing found - he describes as a sharp pain -  Worse with talking/eating/brushing teeth - states certain sour type liquids/ foods makes it worse orajel does help - - pt would like to get further imaging   Past Medical History:  Diagnosis Date  . Essential (primary) hypertension   . Hyperlipidemia   . Hypertension   . Mixed hyperlipidemia   . Neuralgia and neuritis, unspecified     Past Surgical History:  Procedure Laterality Date  . tosillectomy      Family History  Problem Relation Age of Onset  . Lung cancer Mother   . Hypertension Father   . Hyperlipidemia Father   . Cancer Father     Social History   Socioeconomic History  . Marital status: Married    Spouse name: Not on file  . Number of children: 3  . Years of education: Not on file  . Highest education level: Not on file  Occupational History  . Not on file  Tobacco Use  . Smoking status: Former Smoker    Packs/day: 3.00    Years: 25.00    Pack years: 75.00    Start date: 11    Quit date: 2008    Years since quitting: 13.6  . Smokeless tobacco: Never Used  Vaping Use  . Vaping Use: Never used  Substance and Sexual Activity  . Alcohol use: Yes    Comment: Drinks alcohol on a  social basis.  . Drug use: No  . Sexual activity: Yes  Other Topics Concern  . Not on file  Social History Narrative  . Not on file   Social Determinants of Health   Financial Resource Strain:   . Difficulty of Paying Living Expenses: Not on file  Food Insecurity:   . Worried About 2009 in the Last Year: Not on file  . Ran Out of Food in the Last Year: Not on file  Transportation Needs:   . Lack of Transportation (Medical): Not on file  . Lack of Transportation (Non-Medical): Not on file  Physical Activity:   . Days of Exercise per Week: Not on file  . Minutes of Exercise per Session: Not on file  Stress:   . Feeling of Stress : Not on file  Social Connections:   . Frequency of Communication with Friends and Family: Not on file  . Frequency of Social Gatherings with Friends and Family: Not on file  . Attends Religious Services: Not on file  . Active Member of Clubs or Organizations: Not on file  . Attends Programme researcher, broadcasting/film/video Meetings: Not on file  . Marital Status: Not  on file  Intimate Partner Violence:   . Fear of Current or Ex-Partner: Not on file  . Emotionally Abused: Not on file  . Physically Abused: Not on file  . Sexually Abused: Not on file     Current Outpatient Medications:  .  atorvastatin (LIPITOR) 10 MG tablet, Take 1 tablet (10 mg total) by mouth daily., Disp: 90 tablet, Rfl: 1 .  hydrochlorothiazide (HYDRODIURIL) 25 MG tablet, Take 1 tablet (25 mg total) by mouth daily., Disp: 90 tablet, Rfl: 1 .  levocetirizine (XYZAL) 5 MG tablet, Take 1 tablet (5 mg total) by mouth every evening. (Patient not taking: Reported on 09/14/2019), Disp: 30 tablet, Rfl: 3 .  losartan (COZAAR) 50 MG tablet, Take 1 tablet (50 mg total) by mouth daily., Disp: 90 tablet, Rfl: 1 .  sildenafil (REVATIO) 20 MG tablet, 1 tab as needed prior to sex (Patient not taking: Reported on 09/14/2019), Disp: 30 tablet, Rfl: 0   Allergies  Allergen Reactions  . Penicillins Hives     ROS CONSTITUTIONAL: Negative for chills, fatigue, fever, unintentional weight gain and unintentional weight loss.  E/N/T: see HPI CARDIOVASCULAR: Negative for chest pain, dizziness, palpitations and pedal edema.  RESPIRATORY: Negative for recent cough and dyspnea.  GASTROINTESTINAL: Negative for abdominal pain, acid reflux symptoms, constipation, diarrhea, nausea and vomiting.  MSK: Negative for arthralgias and myalgias.  INTEGUMENTARY: Negative for rash.  NEUROLOGICAL: Negative for dizziness and headaches.  PSYCHIATRIC: Negative for sleep disturbance and to question depression screen.  Negative for depression, negative for anhedonia.        Objective:    PHYSICAL EXAM:   VS: BP (!) 178/94 (BP Location: Left Arm, Patient Position: Sitting)   Pulse 82   Temp 97.9 F (36.6 C) (Temporal)   Ht 6\' 1"  (1.854 m)   Wt 271 lb (122.9 kg)   SpO2 98%   BMI 35.75 kg/m   GEN: Well nourished, well developed, in no acute distress  HEENT:  - Lips, Teeth and Gums - normal  Oropharynx - normal mucosa, palate, and posterior pharynx Neck: no JVD or masses - no thyromegaly Cardiac: RRR; no murmurs, rubs, or gallops,no edema - no significant varicosities Respiratory:  normal respiratory rate and pattern with no distress - normal breath sounds with no rales, rhonchi, wheezes or rubs Psych: euthymic mood, appropriate affect and demeanor  BP (!) 178/94 (BP Location: Left Arm, Patient Position: Sitting)   Pulse 82   Temp 97.9 F (36.6 C) (Temporal)   Ht 6\' 1"  (1.854 m)   Wt 271 lb (122.9 kg)   SpO2 98%   BMI 35.75 kg/m  Wt Readings from Last 3 Encounters:  09/14/19 271 lb (122.9 kg)  06/25/17 280 lb 9.6 oz (127.3 kg)  11/22/16 278 lb (126.1 kg)     Health Maintenance Due  Topic Date Due  . Hepatitis C Screening  Never done  . HIV Screening  Never done  . TETANUS/TDAP  Never done  . INFLUENZA VACCINE  08/23/2019    There are no preventive care reminders to display for this  patient.  No results found for: TSH Lab Results  Component Value Date   WBC 7.6 06/25/2017   HGB 16.0 06/25/2017   HCT 45.7 06/25/2017   MCV 87.6 06/25/2017   PLT 258.0 06/25/2017   Lab Results  Component Value Date   NA 139 06/25/2017   K 4.2 06/25/2017   CO2 29 06/25/2017   GLUCOSE 97 06/25/2017   BUN 21 06/25/2017  CREATININE 1.07 06/25/2017   BILITOT 0.9 06/25/2017   ALKPHOS 59 06/25/2017   AST 38 (H) 06/25/2017   ALT 48 06/25/2017   PROT 6.7 06/25/2017   ALBUMIN 4.8 06/25/2017   CALCIUM 9.7 06/25/2017   GFR 76.45 06/25/2017   Lab Results  Component Value Date   CHOL 172 06/25/2017   Lab Results  Component Value Date   HDL 30.70 (L) 06/25/2017   Lab Results  Component Value Date   LDLCALC 105 (H) 06/25/2017   Lab Results  Component Value Date   TRIG 183.0 (H) 06/25/2017   Lab Results  Component Value Date   CHOLHDL 6 06/25/2017   No results found for: HGBA1C    Assessment & Plan:   Problem List Items Addressed This Visit      Cardiovascular and Mediastinum   Essential (primary) hypertension - Primary    RESTART MEDICATIONS AS DIRECTED RECHECK BP IN ONE MONTH LABWORK PENDING      Relevant Medications   atorvastatin (LIPITOR) 10 MG tablet   hydrochlorothiazide (HYDRODIURIL) 25 MG tablet   losartan (COZAAR) 50 MG tablet   Other Relevant Orders   CBC with Differential/Platelet   Comprehensive metabolic panel     Other   Mixed hyperlipidemia    LABWORK PENDING RESTART MEDICATION AS DIRECTED      Relevant Medications   atorvastatin (LIPITOR) 10 MG tablet   hydrochlorothiazide (HYDRODIURIL) 25 MG tablet   losartan (COZAAR) 50 MG tablet   Other Relevant Orders   Lipid panel   Pain in jaw not originating in temporomandibular joint    PT HAS SEEN ENT --- AT THIS POINT RECOMMEND IMAGING OF JAW FOR FURTHER ASSESSMENT      Relevant Orders   CT Maxillofacial WO CM      Meds ordered this encounter  Medications  . atorvastatin  (LIPITOR) 10 MG tablet    Sig: Take 1 tablet (10 mg total) by mouth daily.    Dispense:  90 tablet    Refill:  1    Order Specific Question:   Supervising Provider    AnswerBlane Ohara [id number]  . hydrochlorothiazide (HYDRODIURIL) 25 MG tablet    Sig: Take 1 tablet (25 mg total) by mouth daily.    Dispense:  90 tablet    Refill:  1    Order Specific Question:   Supervising Provider    AnswerBlane Ohara [id number]  . losartan (COZAAR) 50 MG tablet    Sig: Take 1 tablet (50 mg total) by mouth daily.    Dispense:  90 tablet    Refill:  1    Order Specific Question:   Supervising Provider    AnswerCorey Harold    Follow-up: Return in about 4 weeks (around 10/12/2019) for nurse visit - bp follow up.    SARA R Sandralee Tarkington, PA-C

## 2019-09-15 LAB — LIPID PANEL
Chol/HDL Ratio: 6.4 ratio — ABNORMAL HIGH (ref 0.0–5.0)
Cholesterol, Total: 231 mg/dL — ABNORMAL HIGH (ref 100–199)
HDL: 36 mg/dL — ABNORMAL LOW (ref >39)
LDL Chol Calc (NIH): 169 mg/dL — ABNORMAL HIGH (ref 0–99)
Triglycerides: 141 mg/dL (ref 0–149)
VLDL Cholesterol Cal: 26 mg/dL (ref 5–40)

## 2019-09-15 LAB — CBC WITH DIFFERENTIAL/PLATELET
Basophils Absolute: 0 10*3/uL (ref 0.0–0.2)
Basos: 0 %
EOS (ABSOLUTE): 0 10*3/uL (ref 0.0–0.4)
Eos: 0 %
Hematocrit: 46.9 % (ref 37.5–51.0)
Hemoglobin: 16 g/dL (ref 13.0–17.7)
Immature Grans (Abs): 0 10*3/uL (ref 0.0–0.1)
Immature Granulocytes: 0 %
Lymphocytes Absolute: 2.3 10*3/uL (ref 0.7–3.1)
Lymphs: 31 %
MCH: 30.5 pg (ref 26.6–33.0)
MCHC: 34.1 g/dL (ref 31.5–35.7)
MCV: 90 fL (ref 79–97)
Monocytes Absolute: 0.5 10*3/uL (ref 0.1–0.9)
Monocytes: 7 %
Neutrophils Absolute: 4.7 10*3/uL (ref 1.4–7.0)
Neutrophils: 62 %
Platelets: 265 10*3/uL (ref 150–450)
RBC: 5.24 x10E6/uL (ref 4.14–5.80)
RDW: 12.3 % (ref 11.6–15.4)
WBC: 7.6 10*3/uL (ref 3.4–10.8)

## 2019-09-15 LAB — COMPREHENSIVE METABOLIC PANEL
ALT: 34 IU/L (ref 0–44)
AST: 29 IU/L (ref 0–40)
Albumin/Globulin Ratio: 2.5 — ABNORMAL HIGH (ref 1.2–2.2)
Albumin: 4.9 g/dL (ref 3.8–4.9)
Alkaline Phosphatase: 56 IU/L (ref 48–121)
BUN/Creatinine Ratio: 16 (ref 9–20)
BUN: 16 mg/dL (ref 6–24)
Bilirubin Total: 0.7 mg/dL (ref 0.0–1.2)
CO2: 25 mmol/L (ref 20–29)
Calcium: 9.9 mg/dL (ref 8.7–10.2)
Chloride: 106 mmol/L (ref 96–106)
Creatinine, Ser: 0.99 mg/dL (ref 0.76–1.27)
GFR calc Af Amer: 98 mL/min/{1.73_m2} (ref >59)
GFR calc non Af Amer: 85 mL/min/{1.73_m2} (ref >59)
Globulin, Total: 2 g/dL (ref 1.5–4.5)
Glucose: 91 mg/dL (ref 65–99)
Potassium: 4.9 mmol/L (ref 3.5–5.2)
Sodium: 143 mmol/L (ref 134–144)
Total Protein: 6.9 g/dL (ref 6.0–8.5)

## 2019-09-15 LAB — CARDIOVASCULAR RISK ASSESSMENT

## 2019-10-19 ENCOUNTER — Other Ambulatory Visit: Payer: Self-pay | Admitting: Physician Assistant

## 2019-10-19 ENCOUNTER — Ambulatory Visit (INDEPENDENT_AMBULATORY_CARE_PROVIDER_SITE_OTHER): Payer: PRIVATE HEALTH INSURANCE

## 2019-10-19 ENCOUNTER — Other Ambulatory Visit: Payer: Self-pay

## 2019-10-19 VITALS — BP 180/90

## 2019-10-19 DIAGNOSIS — I10 Essential (primary) hypertension: Secondary | ICD-10-CM

## 2019-10-19 MED ORDER — LOSARTAN POTASSIUM 100 MG PO TABS
100.0000 mg | ORAL_TABLET | Freq: Every day | ORAL | 1 refills | Status: DC
Start: 2019-10-19 — End: 2020-03-09

## 2019-10-19 NOTE — Patient Instructions (Signed)
Patient came for nurse visit today to recheck his blood pressure after 3-4 weeks of restarting blood pressure medication. Patient's blood pressure is still not at goal, so patient's Cozzar was increased to 100mg  and was told to schedule a follow up appointment with in 3-4 weeks to recheck again and for patient to bring blood pressure cuff with him to compare readings with. LA

## 2019-11-16 ENCOUNTER — Encounter: Payer: Self-pay | Admitting: Physician Assistant

## 2019-11-16 ENCOUNTER — Other Ambulatory Visit: Payer: Self-pay

## 2019-11-16 ENCOUNTER — Ambulatory Visit: Payer: PRIVATE HEALTH INSURANCE

## 2019-11-16 VITALS — BP 178/90 | Ht 73.0 in

## 2019-11-16 DIAGNOSIS — I10 Essential (primary) hypertension: Secondary | ICD-10-CM

## 2019-11-16 NOTE — Progress Notes (Unsigned)
Pt came in to office for a nurse visit to check his blood pressure. Checking pt's blood pressure with the manual cuff in office, pt's blood pressure was still high it was 178/90 on right arm. Pt could never get his machine to work in office even after taking the batteries out and putting them back in. His last readings on machine that was saved was 148/88. Pt was informed that his machine may need new batteries but also that he may need a newer machine to check. Pt stated he would replace batteries and let us know of his readings.

## 2019-11-18 ENCOUNTER — Other Ambulatory Visit: Payer: Self-pay | Admitting: Physician Assistant

## 2019-11-18 ENCOUNTER — Other Ambulatory Visit: Payer: Self-pay | Admitting: Legal Medicine

## 2019-11-18 DIAGNOSIS — R6884 Jaw pain: Secondary | ICD-10-CM

## 2019-11-27 ENCOUNTER — Other Ambulatory Visit: Payer: Self-pay

## 2019-11-27 ENCOUNTER — Ambulatory Visit (HOSPITAL_COMMUNITY)
Admission: RE | Admit: 2019-11-27 | Discharge: 2019-11-27 | Disposition: A | Discharge status: Home / Self Care | Payer: No Typology Code available for payment source | Source: Ambulatory Visit | Attending: Physician Assistant | Admitting: Physician Assistant

## 2019-11-27 ENCOUNTER — Ambulatory Visit (HOSPITAL_COMMUNITY): Payer: PRIVATE HEALTH INSURANCE

## 2019-11-27 DIAGNOSIS — R6884 Jaw pain: Secondary | ICD-10-CM | POA: Diagnosis not present

## 2019-12-09 ENCOUNTER — Other Ambulatory Visit: Payer: Self-pay | Admitting: Physician Assistant

## 2019-12-09 DIAGNOSIS — I10 Essential (primary) hypertension: Secondary | ICD-10-CM

## 2019-12-14 ENCOUNTER — Other Ambulatory Visit: Payer: Self-pay | Admitting: Physician Assistant

## 2019-12-14 DIAGNOSIS — E782 Mixed hyperlipidemia: Secondary | ICD-10-CM

## 2019-12-25 ENCOUNTER — Telehealth (INDEPENDENT_AMBULATORY_CARE_PROVIDER_SITE_OTHER): Payer: No Typology Code available for payment source | Admitting: Legal Medicine

## 2019-12-25 ENCOUNTER — Encounter: Payer: Self-pay | Admitting: Legal Medicine

## 2019-12-25 VITALS — Temp 99.5°F | Ht 74.0 in | Wt 265.0 lb

## 2019-12-25 DIAGNOSIS — U071 COVID-19: Secondary | ICD-10-CM | POA: Diagnosis not present

## 2019-12-25 DIAGNOSIS — Z20822 Contact with and (suspected) exposure to covid-19: Secondary | ICD-10-CM | POA: Diagnosis not present

## 2019-12-25 LAB — POC COVID19 BINAXNOW: SARS Coronavirus 2 Ag: NEGATIVE

## 2019-12-25 NOTE — Progress Notes (Signed)
Telephone visit  Subjective:    Patient ID: Tim Welch, male    DOB: [birth year], 54 y.o.   MRN: [id number]  Chief Complaint  Patient presents with  . Covid Positive    HPI Patient is in today for a at home positive covid test, symptoms started Tuesday evening. Has been taking ibuprofen to help with hiw low grade fever. Had some minor stomach pain on Tuesday evening. Diffuse myalgias.  He is now worse and had a positive home test.  He is feeling worse.  Past Medical History:  Diagnosis Date  . Essential (primary) hypertension   . Hyperlipidemia   . Hypertension   . Mixed hyperlipidemia   . Neuralgia and neuritis, unspecified     Past Surgical History:  Procedure Laterality Date  . tosillectomy      Family History  Problem Relation Age of Onset  . Lung cancer Mother   . Hypertension Father   . Hyperlipidemia Father   . Cancer Father     Social History   Socioeconomic History  . Marital status: Married    Spouse name: Not on file  . Number of children: 3  . Years of education: Not on file  . Highest education level: Not on file  Occupational History  . Not on file  Tobacco Use  . Smoking status: Former Smoker    Packs/day: 3.00    Years: 25.00    Pack years: 75.00    Start date: 44    Quit date: 2008    Years since quitting: 13.9  . Smokeless tobacco: Never Used  Vaping Use  . Vaping Use: Never used  Substance and Sexual Activity  . Alcohol use: Yes    Comment: Drinks alcohol on a social basis.  . Drug use: No  . Sexual activity: Yes  Other Topics Concern  . Not on file  Social History Narrative  . Not on file   Social Determinants of Health   Financial Resource Strain:   . Difficulty of Paying Living Expenses: Not on file  Food Insecurity:   . Worried About Programme researcher, broadcasting/film/video in the Last Year: Not on file  . Ran Out of Food in the Last Year: Not on file  Transportation Needs:   . Lack of Transportation (Medical): Not on file  . Lack of  Transportation (Non-Medical): Not on file  Physical Activity:   . Days of Exercise per Week: Not on file  . Minutes of Exercise per Session: Not on file  Stress:   . Feeling of Stress : Not on file  Social Connections:   . Frequency of Communication with Friends and Family: Not on file  . Frequency of Social Gatherings with Friends and Family: Not on file  . Attends Religious Services: Not on file  . Active Member of Clubs or Organizations: Not on file  . Attends Banker Meetings: Not on file  . Marital Status: Not on file  Intimate Partner Violence:   . Fear of Current or Ex-Partner: Not on file  . Emotionally Abused: Not on file  . Physically Abused: Not on file  . Sexually Abused: Not on file    Outpatient Medications Prior to Visit  Medication Sig Dispense Refill  . atorvastatin (LIPITOR) 10 MG tablet TAKE 1 TABLET BY MOUTH EVERY DAY FOR CHOLESTEROL 90 tablet 0  . hydrochlorothiazide (HYDRODIURIL) 25 MG tablet TAKE 1 TABLET BY MOUTH EVERY DAY 90 tablet 0  . levocetirizine (XYZAL) 5 MG tablet  Take 1 tablet (5 mg total) by mouth every evening. 30 tablet 3  . losartan (COZAAR) 100 MG tablet Take 1 tablet (100 mg total) by mouth daily. 30 tablet 1  . sildenafil (REVATIO) 20 MG tablet 1 tab as needed prior to sex 30 tablet 0   No facility-administered medications prior to visit.    Allergies  Allergen Reactions  . Penicillins Hives    Review of Systems  Constitutional: Positive for chills. Negative for fever (Low grade 99.5 yesterday).  HENT: Positive for sore throat. Negative for congestion.   Respiratory: Negative for shortness of breath.   Cardiovascular: Negative for chest pain.  Gastrointestinal: Negative for diarrhea and nausea.  Musculoskeletal:       Body aches  Neurological: Negative for headaches.       Objective:    Physical Exam na Temp 99.5 F (37.5 C)   Ht 6\' 2"  (1.88 m)   Wt 265 lb (120.2 kg)   BMI 34.02 kg/m  Wt Readings from Last 3  Encounters:  12/25/19 265 lb (120.2 kg)  09/14/19 271 lb (122.9 kg)  06/25/17 280 lb 9.6 oz (127.3 kg)    Health Maintenance Due  Topic Date Due  . Hepatitis C Screening  Never done  . HIV Screening  Never done  . TETANUS/TDAP  Never done  . INFLUENZA VACCINE  08/23/2019    There are no preventive care reminders to display for this patient.   No results found for: TSH Lab Results  Component Value Date   WBC 7.6 09/14/2019   HGB 16.0 09/14/2019   HCT 46.9 09/14/2019   MCV 90 09/14/2019   PLT 265 09/14/2019   Lab Results  Component Value Date   NA 143 09/14/2019   K 4.9 09/14/2019   CO2 25 09/14/2019   GLUCOSE 91 09/14/2019   BUN 16 09/14/2019   CREATININE 0.99 09/14/2019   BILITOT 0.7 09/14/2019   ALKPHOS 56 09/14/2019   AST 29 09/14/2019   ALT 34 09/14/2019   PROT 6.9 09/14/2019   ALBUMIN 4.9 09/14/2019   CALCIUM 9.9 09/14/2019   GFR 76.45 06/25/2017   Lab Results  Component Value Date   CHOL 231 (H) 09/14/2019   Lab Results  Component Value Date   HDL 36 (L) 09/14/2019   Lab Results  Component Value Date   LDLCALC 169 (H) 09/14/2019   Lab Results  Component Value Date   TRIG 141 09/14/2019   Lab Results  Component Value Date   CHOLHDL 6.4 (H) 09/14/2019   No results found for: HGBA1C     Assessment & Plan:   Diagnoses and all orders for this visit: Close exposure to COVID-19 virus -     POC COVID-19 BinaxNow COVID Patient has symptoms consistent with COVID-19 and had a positive test at home.  Her test is equivocal at the office.  We will set him up for infusion based on his home test and his symptoms.  He will get his wife tested at low power.      I spent 20 minutes dedicated to the care of this patient on the date of this encounter to include face-to-face time with the patient, as well as: Had extensive visit with patient over the phone and we discussed the options for his care.  He wants to get the infusions since he has family members  with adverse Covid problems. Follow-up: Return if symptoms worsen or fail to improve.  An After Visit Summary was printed and given  to the patient.  Reinaldo Meeker, MD [facility] [phone]

## 2019-12-26 ENCOUNTER — Telehealth: Payer: Self-pay | Admitting: Infectious Diseases

## 2019-12-26 NOTE — Telephone Encounter (Signed)
Called to Discuss with patient about Covid symptoms and the use of the monoclonal antibody infusion for those with mild to moderate Covid symptoms and at a high risk of hospitalization.     Pt appears to qualify for this infusion due to co-morbid conditions and/or a member of an at-risk group in accordance with the FDA Emergency Use Authorization.    Fevers broke today - feeling better and feels like crap. He started on Tuesday of this week.   Sx started Tuesday 11/30. One rapid binax now at home that was positive, in office test negative yesterday. His wife's home binaxnow test was also negative.   I have asked them to go get a PCR test to better rule out COVID19. Explained the difference and how this will be more helpful for them regarding isolation recommendations and treatment options. Symptoms seem to have been pretty abrupt - suspect it may be influenza not COVID.     Rexene Alberts, MSN, NP-C [facility]  [email or web address] Pager: [phone] Office: [phone] [facility] Main Line: [phone]

## 2019-12-31 ENCOUNTER — Telehealth: Payer: Self-pay | Admitting: Physician Assistant

## 2019-12-31 NOTE — Telephone Encounter (Signed)
Pt declined MAB for COVID-19 infection.

## 2020-03-09 ENCOUNTER — Other Ambulatory Visit: Payer: Self-pay | Admitting: Physician Assistant

## 2020-03-09 DIAGNOSIS — I10 Essential (primary) hypertension: Secondary | ICD-10-CM

## 2020-03-09 DIAGNOSIS — E782 Mixed hyperlipidemia: Secondary | ICD-10-CM

## 2020-03-09 MED ORDER — LOSARTAN POTASSIUM 100 MG PO TABS: 100.0000 mg | ORAL_TABLET | Freq: Every day | ORAL | 0 refills | Status: DC

## 2020-04-15 ENCOUNTER — Other Ambulatory Visit: Payer: Self-pay

## 2020-04-15 ENCOUNTER — Emergency Department (HOSPITAL_BASED_OUTPATIENT_CLINIC_OR_DEPARTMENT_OTHER)
Admission: EM | Admit: 2020-04-15 | Discharge: 2020-04-15 | Disposition: A | Discharge status: Home / Self Care | Payer: No Typology Code available for payment source | Source: Home / Self Care | Attending: Emergency Medicine | Admitting: Emergency Medicine

## 2020-04-15 ENCOUNTER — Encounter (HOSPITAL_BASED_OUTPATIENT_CLINIC_OR_DEPARTMENT_OTHER): Payer: Self-pay

## 2020-04-15 ENCOUNTER — Emergency Department (HOSPITAL_BASED_OUTPATIENT_CLINIC_OR_DEPARTMENT_OTHER): Payer: No Typology Code available for payment source

## 2020-04-15 DIAGNOSIS — E860 Dehydration: Secondary | ICD-10-CM | POA: Insufficient documentation

## 2020-04-15 DIAGNOSIS — E876 Hypokalemia: Secondary | ICD-10-CM | POA: Insufficient documentation

## 2020-04-15 DIAGNOSIS — M542 Cervicalgia: Secondary | ICD-10-CM | POA: Insufficient documentation

## 2020-04-15 DIAGNOSIS — Z79899 Other long term (current) drug therapy: Secondary | ICD-10-CM | POA: Insufficient documentation

## 2020-04-15 DIAGNOSIS — Z87891 Personal history of nicotine dependence: Secondary | ICD-10-CM | POA: Insufficient documentation

## 2020-04-15 DIAGNOSIS — Z8616 Personal history of COVID-19: Secondary | ICD-10-CM | POA: Insufficient documentation

## 2020-04-15 DIAGNOSIS — I10 Essential (primary) hypertension: Secondary | ICD-10-CM | POA: Insufficient documentation

## 2020-04-15 DIAGNOSIS — L03211 Cellulitis of face: Secondary | ICD-10-CM | POA: Insufficient documentation

## 2020-04-15 DIAGNOSIS — K047 Periapical abscess without sinus: Secondary | ICD-10-CM | POA: Diagnosis not present

## 2020-04-15 DIAGNOSIS — L0201 Cutaneous abscess of face: Secondary | ICD-10-CM | POA: Diagnosis not present

## 2020-04-15 LAB — CBC WITH DIFFERENTIAL/PLATELET
Abs Immature Granulocytes: 0.2 10*3/uL — ABNORMAL HIGH (ref 0.00–0.07)
Basophils Absolute: 0.1 10*3/uL (ref 0.0–0.1)
Basophils Relative: 0 %
Eosinophils Absolute: 0.1 10*3/uL (ref 0.0–0.5)
Eosinophils Relative: 0 %
HCT: 41.5 % (ref 39.0–52.0)
Hemoglobin: 15.2 g/dL (ref 13.0–17.0)
Immature Granulocytes: 1 %
Lymphocytes Relative: 3 %
Lymphs Abs: 0.6 10*3/uL — ABNORMAL LOW (ref 0.7–4.0)
MCH: 30.5 pg (ref 26.0–34.0)
MCHC: 36.6 g/dL — ABNORMAL HIGH (ref 30.0–36.0)
MCV: 83.3 fL (ref 80.0–100.0)
Monocytes Absolute: 1 10*3/uL (ref 0.1–1.0)
Monocytes Relative: 5 %
Neutro Abs: 15.7 10*3/uL — ABNORMAL HIGH (ref 1.7–7.7)
Neutrophils Relative %: 91 %
Platelets: 241 10*3/uL (ref 150–400)
RBC: 4.98 MIL/uL (ref 4.22–5.81)
RDW: 11.9 % (ref 11.5–15.5)
WBC: 17.6 10*3/uL — ABNORMAL HIGH (ref 4.0–10.5)
nRBC: 0 % (ref 0.0–0.2)

## 2020-04-15 LAB — BASIC METABOLIC PANEL
Anion gap: 16 — ABNORMAL HIGH (ref 5–15)
BUN: 14 mg/dL (ref 6–20)
CO2: 29 mmol/L (ref 22–32)
Calcium: 9.5 mg/dL (ref 8.9–10.3)
Chloride: 87 mmol/L — ABNORMAL LOW (ref 98–111)
Creatinine, Ser: 1.57 mg/dL — ABNORMAL HIGH (ref 0.61–1.24)
GFR, Estimated: 51 mL/min — ABNORMAL LOW (ref >60)
Glucose, Bld: 146 mg/dL — ABNORMAL HIGH (ref 70–99)
Potassium: 2.5 mmol/L — CL (ref 3.5–5.1)
Sodium: 132 mmol/L — ABNORMAL LOW (ref 135–145)

## 2020-04-15 LAB — LACTIC ACID, PLASMA
Lactic Acid, Venous: 2 mmol/L (ref 0.5–1.9)
Lactic Acid, Venous: 1.4 mmol/L (ref 0.5–1.9)

## 2020-04-15 MED ORDER — POTASSIUM CHLORIDE ER 10 MEQ PO TBCR
10.0000 meq | EXTENDED_RELEASE_TABLET | Freq: Every day | ORAL | 0 refills | Status: DC
Start: 2020-04-15 — End: 2022-03-27

## 2020-04-15 MED ORDER — SODIUM CHLORIDE 0.9 % IV BOLUS
1000.0000 mL | Freq: Once | INTRAVENOUS | Status: AC
  Administered 2020-04-15: 1000 mL via INTRAVENOUS

## 2020-04-15 MED ORDER — IOHEXOL 300 MG/ML  SOLN
100.0000 mL | Freq: Once | INTRAMUSCULAR | Status: AC | PRN
  Administered 2020-04-15: 75 mL via INTRAVENOUS

## 2020-04-15 MED ORDER — POTASSIUM CHLORIDE CRYS ER 20 MEQ PO TBCR
40.0000 meq | EXTENDED_RELEASE_TABLET | Freq: Once | ORAL | Status: AC
  Administered 2020-04-15: 40 meq via ORAL
  Filled 2020-04-15: qty 2

## 2020-04-15 MED ORDER — POTASSIUM CHLORIDE 10 MEQ/100ML IV SOLN
10.0000 meq | INTRAVENOUS | Status: AC
Start: 2020-04-15 — End: 2020-04-15
  Administered 2020-04-15 (×2): 10 meq via INTRAVENOUS
  Filled 2020-04-15 (×2): qty 100

## 2020-04-15 MED ORDER — DEXAMETHASONE SODIUM PHOSPHATE 10 MG/ML IJ SOLN
10.0000 mg | Freq: Once | INTRAMUSCULAR | Status: AC
  Administered 2020-04-15: 10 mg via INTRAVENOUS
  Filled 2020-04-15: qty 1

## 2020-04-15 MED ORDER — CLINDAMYCIN PHOSPHATE 600 MG/50ML IV SOLN
600.0000 mg | Freq: Once | INTRAVENOUS | Status: AC
  Administered 2020-04-15: 600 mg via INTRAVENOUS
  Filled 2020-04-15: qty 50

## 2020-04-15 MED ORDER — MORPHINE SULFATE (PF) 4 MG/ML IV SOLN
4.0000 mg | Freq: Once | INTRAVENOUS | Status: AC
  Administered 2020-04-15: 4 mg via INTRAVENOUS
  Filled 2020-04-15: qty 1

## 2020-04-15 MED ORDER — SODIUM CHLORIDE 0.9 % IV SOLN
INTRAVENOUS | Status: DC | PRN
  Administered 2020-04-15: 1000 mL via INTRAVENOUS

## 2020-04-15 NOTE — ED Triage Notes (Signed)
Pt states that he has been on antibiotics for a dental infection since Sunday. Pt started with a Z-Pack, went to oral surgeon doctor and was placed on Clindamycin starting Tuesday. Pt has been vomiting with some chills.

## 2020-04-15 NOTE — Discharge Instructions (Addendum)
You were seen in the emergency department for dental pain and face and neck swelling and pain.  Your infection cell count was elevated.  You were dehydrated and low in potassium.  You were given IV antibiotics fluids and potassium with improvement in your symptoms.  You also received a dose of steroids.  A CAT scan did not show any abscess but did show signs of cellulitis.  You need to continue your antibiotics and closely follow-up with your primary care doctor and your dental providers.  Return if any worsening or concerning symptoms

## 2020-04-15 NOTE — ED Provider Notes (Signed)
[facility] EMERGENCY DEPARTMENT Provider Note   CSN: [id number] Arrival date & time: 04/15/20  1710     History Chief Complaint  Patient presents with  . Dental Problem    Tim Welch is a 55 y.o. male.  He started with dental pain throughout 10 or 11 days ago after having a dental cleaning with his dentist.  Tim Welch through a course of Zithromax without any improvement.  Endodontist has put him on clindamycin which she has been using for 3 or 4 days.  Facial swelling getting worse and extending into his anterior neck.  Pain with swallowing.  1 episode of fever 1 episode of shaking chills today.  Few episodes of vomiting during this time.  Dental surgeon said he cannot do an extraction until the swelling comes down.  The history is provided by the patient.  Dental Pain Location:  Lower Lower teeth location:  31/RL 2nd molar Quality:  Aching and constant Severity:  Severe Onset quality:  Gradual Duration:  10 days Timing:  Constant Progression:  Worsening Chronicity:  New Context: abscess   Previous work-up:  Filled cavity Relieved by:  Nothing Worsened by:  Jaw movement and pressure Ineffective treatments:  Acetaminophen and NSAIDs Associated symptoms: facial pain, facial swelling, fever, neck pain, neck swelling and trismus   Associated symptoms: no difficulty swallowing and no headaches        Past Medical History:  Diagnosis Date  . Essential (primary) hypertension   . Hyperlipidemia   . Hypertension   . Mixed hyperlipidemia   . Neuralgia and neuritis, unspecified     Patient Active Problem List   Diagnosis Date Noted  . COVID 12/25/2019  . Pain in jaw not originating in temporomandibular joint 09/14/2019  . Essential (primary) hypertension   . Mixed hyperlipidemia   . Atrial premature depolarization 10/23/2013  . Familial hypercholesterolemia 10/23/2013  . Erectile dysfunction due to arterial insufficiency 10/22/2013  . Family history of malignant  neoplasm of prostate 10/22/2013    Past Surgical History:  Procedure Laterality Date  . tosillectomy         Family History  Problem Relation Age of Onset  . Lung cancer Mother   . Hypertension Father   . Hyperlipidemia Father   . Cancer Father     Social History   Tobacco Use  . Smoking status: Former Smoker    Packs/day: 3.00    Years: 25.00    Pack years: 75.00    Start date: 40    Quit date: 2008    Years since quitting: 14.2  . Smokeless tobacco: Never Used  Vaping Use  . Vaping Use: Never used  Substance Use Topics  . Alcohol use: Yes    Comment: Drinks alcohol on a social basis.  . Drug use: No    Home Medications Prior to Admission medications   Medication Sig Start Date End Date Taking? Authorizing Provider  atorvastatin (LIPITOR) 10 MG tablet TAKE 1 TABLET BY MOUTH EVERY DAY FOR CHOLESTEROL 03/09/20   Marianne Sofia, PA-C  hydrochlorothiazide (HYDRODIURIL) 25 MG tablet TAKE 1 TABLET BY MOUTH EVERY DAY 03/09/20   Marianne Sofia, PA-C  levocetirizine (XYZAL) 5 MG tablet Take 1 tablet (5 mg total) by mouth every evening. 05/06/18   Saguier, Ramon Dredge, PA-C  losartan (COZAAR) 100 MG tablet Take 1 tablet (100 mg total) by mouth daily. 03/09/20   Marianne Sofia, PA-C  sildenafil (REVATIO) 20 MG tablet 1 tab as needed prior to sex 06/25/17   Saguier,  Ramon Dredge, PA-C    Allergies    Penicillins  Review of Systems   Review of Systems  Constitutional: Positive for fever.  HENT: Positive for facial swelling and trouble swallowing.   Eyes: Negative for visual disturbance.  Respiratory: Negative for shortness of breath.   Cardiovascular: Negative for chest pain.  Gastrointestinal: Negative for abdominal pain.  Genitourinary: Negative for dysuria.  Musculoskeletal: Positive for neck pain.  Skin: Negative for rash.  Neurological: Negative for headaches.    Physical Exam Updated Vital Signs BP 125/66 (BP Location: Left Arm)   Pulse 95   Temp 98 F (36.7 C) (Oral)   Resp  18   Ht 6\' 2"  (1.88 m)   Wt 117.9 kg   SpO2 100%   BMI 33.38 kg/m   Physical Exam Vitals and nursing note reviewed.  Constitutional:      Appearance: Normal appearance. He is well-developed. He is not ill-appearing.  HENT:     Head: Normocephalic and atraumatic.     Mouth/Throat:     Mouth: Mucous membranes are moist.     Pharynx: Posterior oropharyngeal erythema present.     Comments: Some gingival edema and erythema.  He can open his mouth 2 finger breaths. Eyes:     Conjunctiva/sclera: Conjunctivae normal.  Cardiovascular:     Rate and Rhythm: Normal rate and regular rhythm.     Heart sounds: No murmur heard.   Pulmonary:     Effort: Pulmonary effort is normal. No respiratory distress.     Breath sounds: Normal breath sounds.  Abdominal:     Palpations: Abdomen is soft.     Tenderness: There is no abdominal tenderness.  Musculoskeletal:        General: No deformity or signs of injury. Normal range of motion.     Cervical back: Neck supple. Tenderness (right upper, below jaw) present.  Skin:    General: Skin is warm and dry.  Neurological:     General: No focal deficit present.     Mental Status: He is alert.     ED Results / Procedures / Treatments   Labs (all labs ordered are listed, but only abnormal results are displayed) Labs Reviewed  BASIC METABOLIC PANEL - Abnormal; Notable for the following components:      Result Value   Sodium 132 (*)    Potassium 2.5 (*)    Chloride 87 (*)    Glucose, Bld 146 (*)    Creatinine, Ser 1.57 (*)    GFR, Estimated 51 (*)    Anion gap 16 (*)    All other components within normal limits  CBC WITH DIFFERENTIAL/PLATELET - Abnormal; Notable for the following components:   WBC 17.6 (*)    MCHC 36.6 (*)    Neutro Abs 15.7 (*)    Lymphs Abs 0.6 (*)    Abs Immature Granulocytes 0.20 (*)    All other components within normal limits  LACTIC ACID, PLASMA - Abnormal; Notable for the following components:   Lactic Acid, Venous  2.0 (*)    All other components within normal limits  CULTURE, BLOOD (ROUTINE X 2)  CULTURE, BLOOD (ROUTINE X 2)  LACTIC ACID, PLASMA    EKG None  Radiology CT Soft Tissue Neck W Contrast  Result Date: 04/15/2020 CLINICAL DATA:  Right lower molar dental abscess EXAM: CT NECK WITH CONTRAST TECHNIQUE: Multidetector CT imaging of the neck was performed using the standard protocol following the bolus administration of intravenous contrast. CONTRAST:  23mL OMNIPAQUE  IOHEXOL 300 MG/ML  SOLN COMPARISON:  None. FINDINGS: Pharynx and larynx: Unremarkable.  No mass or swelling. Salivary glands: Partial fatty replacement of the parotids. Left submandibular gland is unremarkable. Right submandibular gland is surrounded by inflammatory changes and appears relatively enlarged. Thyroid: Normal. Lymph nodes: Asymmetric likely reactive nonenlarged right cervical lymph nodes in the suprahyoid neck. Vascular: Major neck vessels are patent. Trace calcified plaque at the common carotid bifurcations. Limited intracranial: No abnormal enhancement. Visualized orbits: Unremarkable. Mastoids and visualized paranasal sinuses: Minor mucosal thickening. Mastoid air cells are clear. Skeleton: Mild cervical spine degenerative changes. Upper chest: Included upper lungs are clear. Other: Infiltration of the fat lower right face and submandibular space extending along the lingual surface of the mandible. No evidence of abscess. IMPRESSION: Right lower facial cellulitis with involvement of the submandibular space and lingual surface of the mandible. No soft tissue abscess. The submandibular gland is primarily or secondarily involved. There is no periapical lucency. Electronically Signed   By: Guadlupe Spanish M.D.   On: 04/15/2020 20:27    Procedures Procedures   Medications Ordered in ED Medications  clindamycin (CLEOCIN) IVPB 600 mg (0 mg Intravenous Stopped 04/15/20 1900)  sodium chloride 0.9 % bolus 1,000 mL (0 mLs Intravenous  Stopped 04/15/20 1920)  morphine 4 MG/ML injection 4 mg (4 mg Intravenous Given 04/15/20 1839)  dexamethasone (DECADRON) injection 10 mg (10 mg Intravenous Given 04/15/20 1838)  sodium chloride 0.9 % bolus 1,000 mL (0 mLs Intravenous Stopped 04/15/20 2230)  potassium chloride 10 mEq in 100 mL IVPB (0 mEq Intravenous Stopped 04/15/20 2230)  iohexol (OMNIPAQUE) 300 MG/ML solution 100 mL (75 mLs Intravenous Contrast Given 04/15/20 1941)  potassium chloride SA (KLOR-CON) CR tablet 40 mEq (40 mEq Oral Given 04/15/20 2054)    ED Course  I have reviewed the triage vital signs and the nursing notes.  Pertinent labs & imaging results that were available during my care of the patient were reviewed by me and considered in my medical decision making (see chart for details).  Clinical Course as of 04/16/20 1028  Fri Apr 15, 2020  2135 Patient's lactate cleared.  He is received some oral and IV potassium and along with IV fluids.  IV steroids.  IV antibiotics.  He feels much better and is hoping to be discharged.  CT showing cellulitis but no discrete abscess.  Recommended close follow-up with his oral surgeon and clear return instructions discussed. [MB]    Clinical Course User Index [MB] Terrilee Files, MD   MDM Rules/Calculators/A&P                         This patient complains of dental pain facial swelling difficulty swallowing; this involves an extensive number of treatment Options and is a complaint that carries with it a high risk of complications and Morbidity. The differential includes dental abscess, facial cellulitis, [place], sepsis, dehydration, metabolic derangement  I ordered, reviewed and interpreted labs, which included CBC with elevated white count, chemistries with creatinine and low potassium reflecting dehydration hypokalemia, lactic acid elevated which cleared I ordered medication IV antibiotics, IV fluids, IV and oral potassium, IV pain medicine, IV steroids I ordered imaging  studies which included a soft tissue neck and I independently    visualized and interpreted imaging which showed facial cellulitis, no discrete abscess Additional history obtained from patient's daughter Previous records obtained and reviewed, no recent admissions  After the interventions stated above, I reevaluated the patient and  found patient to be feeling improved.  Discussed options of admission for continued IV antibiotics and fluids versus home with close follow-up with his treating physicians.  Patient elects to go home.  Return instructions discussed.   Final Clinical Impression(s) / ED Diagnoses Final diagnoses:  Facial cellulitis  Hypokalemia  Dehydration    Rx / DC Orders ED Discharge Orders         Ordered    potassium chloride (KLOR-CON) 10 MEQ tablet  Daily        04/15/20 2137           Terrilee FilesButler, Mistie Adney C, MD 04/16/20 1030

## 2020-04-17 ENCOUNTER — Encounter (HOSPITAL_BASED_OUTPATIENT_CLINIC_OR_DEPARTMENT_OTHER): Payer: Self-pay

## 2020-04-17 ENCOUNTER — Inpatient Hospital Stay (HOSPITAL_COMMUNITY): Payer: No Typology Code available for payment source

## 2020-04-17 ENCOUNTER — Emergency Department (HOSPITAL_BASED_OUTPATIENT_CLINIC_OR_DEPARTMENT_OTHER): Payer: No Typology Code available for payment source

## 2020-04-17 ENCOUNTER — Inpatient Hospital Stay (HOSPITAL_BASED_OUTPATIENT_CLINIC_OR_DEPARTMENT_OTHER)
Admission: EM | Admit: 2020-04-17 | Discharge: 2020-04-21 | DRG: 158 | Disposition: A | Discharge status: Home / Self Care | Payer: No Typology Code available for payment source | Attending: Internal Medicine | Admitting: Internal Medicine

## 2020-04-17 ENCOUNTER — Other Ambulatory Visit: Payer: Self-pay

## 2020-04-17 DIAGNOSIS — M792 Neuralgia and neuritis, unspecified: Secondary | ICD-10-CM | POA: Diagnosis present

## 2020-04-17 DIAGNOSIS — Z87891 Personal history of nicotine dependence: Secondary | ICD-10-CM | POA: Diagnosis not present

## 2020-04-17 DIAGNOSIS — K047 Periapical abscess without sinus: Principal | ICD-10-CM | POA: Diagnosis present

## 2020-04-17 DIAGNOSIS — K122 Cellulitis and abscess of mouth: Secondary | ICD-10-CM | POA: Diagnosis present

## 2020-04-17 DIAGNOSIS — Z8249 Family history of ischemic heart disease and other diseases of the circulatory system: Secondary | ICD-10-CM

## 2020-04-17 DIAGNOSIS — E782 Mixed hyperlipidemia: Secondary | ICD-10-CM | POA: Diagnosis present

## 2020-04-17 DIAGNOSIS — I10 Essential (primary) hypertension: Secondary | ICD-10-CM | POA: Diagnosis present

## 2020-04-17 DIAGNOSIS — Z801 Family history of malignant neoplasm of trachea, bronchus and lung: Secondary | ICD-10-CM | POA: Diagnosis not present

## 2020-04-17 DIAGNOSIS — L0211 Cutaneous abscess of neck: Secondary | ICD-10-CM | POA: Diagnosis present

## 2020-04-17 DIAGNOSIS — K0889 Other specified disorders of teeth and supporting structures: Secondary | ICD-10-CM | POA: Diagnosis present

## 2020-04-17 DIAGNOSIS — Z8042 Family history of malignant neoplasm of prostate: Secondary | ICD-10-CM

## 2020-04-17 DIAGNOSIS — Z8616 Personal history of COVID-19: Secondary | ICD-10-CM | POA: Diagnosis not present

## 2020-04-17 DIAGNOSIS — Z83438 Family history of other disorder of lipoprotein metabolism and other lipidemia: Secondary | ICD-10-CM | POA: Diagnosis not present

## 2020-04-17 DIAGNOSIS — E7801 Familial hypercholesterolemia: Secondary | ICD-10-CM | POA: Diagnosis present

## 2020-04-17 DIAGNOSIS — E860 Dehydration: Secondary | ICD-10-CM | POA: Diagnosis present

## 2020-04-17 DIAGNOSIS — L03211 Cellulitis of face: Secondary | ICD-10-CM | POA: Diagnosis present

## 2020-04-17 DIAGNOSIS — L0201 Cutaneous abscess of face: Secondary | ICD-10-CM | POA: Diagnosis present

## 2020-04-17 DIAGNOSIS — Z20822 Contact with and (suspected) exposure to covid-19: Secondary | ICD-10-CM | POA: Diagnosis present

## 2020-04-17 DIAGNOSIS — Z79899 Other long term (current) drug therapy: Secondary | ICD-10-CM

## 2020-04-17 DIAGNOSIS — Z880 Allergy status to penicillin: Secondary | ICD-10-CM | POA: Diagnosis not present

## 2020-04-17 DIAGNOSIS — E876 Hypokalemia: Secondary | ICD-10-CM | POA: Diagnosis present

## 2020-04-17 DIAGNOSIS — K041 Necrosis of pulp: Secondary | ICD-10-CM | POA: Diagnosis present

## 2020-04-17 HISTORY — DX: Nausea with vomiting, unspecified: R11.2

## 2020-04-17 HISTORY — DX: Other complications of anesthesia, initial encounter: T88.59XA

## 2020-04-17 HISTORY — DX: Other specified postprocedural states: Z98.890

## 2020-04-17 LAB — CBC WITH DIFFERENTIAL/PLATELET
Abs Immature Granulocytes: 0.16 10*3/uL — ABNORMAL HIGH (ref 0.00–0.07)
Basophils Absolute: 0 10*3/uL (ref 0.0–0.1)
Basophils Relative: 0 %
Eosinophils Absolute: 0 10*3/uL (ref 0.0–0.5)
Eosinophils Relative: 0 %
HCT: 38.3 % — ABNORMAL LOW (ref 39.0–52.0)
Hemoglobin: 13.8 g/dL (ref 13.0–17.0)
Immature Granulocytes: 1 %
Lymphocytes Relative: 15 %
Lymphs Abs: 2 10*3/uL (ref 0.7–4.0)
MCH: 30.6 pg (ref 26.0–34.0)
MCHC: 36 g/dL (ref 30.0–36.0)
MCV: 84.9 fL (ref 80.0–100.0)
Monocytes Absolute: 1.4 10*3/uL — ABNORMAL HIGH (ref 0.1–1.0)
Monocytes Relative: 10 %
Neutro Abs: 9.9 10*3/uL — ABNORMAL HIGH (ref 1.7–7.7)
Neutrophils Relative %: 74 %
Platelets: 248 10*3/uL (ref 150–400)
RBC: 4.51 MIL/uL (ref 4.22–5.81)
RDW: 12.1 % (ref 11.5–15.5)
WBC: 13.5 10*3/uL — ABNORMAL HIGH (ref 4.0–10.5)
nRBC: 0 % (ref 0.0–0.2)

## 2020-04-17 LAB — BASIC METABOLIC PANEL
Anion gap: 13 (ref 5–15)
BUN: 20 mg/dL (ref 6–20)
CO2: 29 mmol/L (ref 22–32)
Calcium: 9.3 mg/dL (ref 8.9–10.3)
Chloride: 95 mmol/L — ABNORMAL LOW (ref 98–111)
Creatinine, Ser: 1.11 mg/dL (ref 0.61–1.24)
GFR, Estimated: >60 mL/min (ref >60)
Glucose, Bld: 125 mg/dL — ABNORMAL HIGH (ref 70–99)
Potassium: 2.9 mmol/L — ABNORMAL LOW (ref 3.5–5.1)
Sodium: 137 mmol/L (ref 135–145)

## 2020-04-17 LAB — LACTIC ACID, PLASMA: Lactic Acid, Venous: 1.2 mmol/L (ref 0.5–1.9)

## 2020-04-17 LAB — RESP PANEL BY RT-PCR (FLU A&B, COVID) ARPGX2
Influenza A by PCR: NEGATIVE
Influenza B by PCR: NEGATIVE
SARS Coronavirus 2 by RT PCR: NEGATIVE

## 2020-04-17 MED ORDER — POTASSIUM CHLORIDE CRYS ER 20 MEQ PO TBCR
40.0000 meq | EXTENDED_RELEASE_TABLET | Freq: Once | ORAL | Status: AC
  Administered 2020-04-17: 40 meq via ORAL
  Filled 2020-04-17: qty 2

## 2020-04-17 MED ORDER — METRONIDAZOLE IN NACL 5-0.79 MG/ML-% IV SOLN: 500.0000 mg | INTRAVENOUS | Status: DC

## 2020-04-17 MED ORDER — ONDANSETRON HCL 4 MG/2ML IJ SOLN
4.0000 mg | Freq: Four times a day (QID) | INTRAMUSCULAR | Status: DC | PRN
  Administered 2020-04-17: 4 mg via INTRAVENOUS
  Filled 2020-04-17: qty 2

## 2020-04-17 MED ORDER — ACETAMINOPHEN 160 MG/5ML PO SOLN
650.0000 mg | Freq: Four times a day (QID) | ORAL | Status: DC | PRN
  Administered 2020-04-18 – 2020-04-20 (×5): 650 mg via ORAL
  Filled 2020-04-17 (×5): qty 20.3

## 2020-04-17 MED ORDER — FENTANYL CITRATE (PF) 100 MCG/2ML IJ SOLN
50.0000 ug | Freq: Once | INTRAMUSCULAR | Status: AC
  Administered 2020-04-17: 50 ug via INTRAVENOUS
  Filled 2020-04-17: qty 2

## 2020-04-17 MED ORDER — ZOLPIDEM TARTRATE 5 MG PO TABS: 5.0000 mg | ORAL_TABLET | Freq: Every evening | ORAL | Status: DC | PRN

## 2020-04-17 MED ORDER — SODIUM CHLORIDE 0.9 % IV SOLN
2.0000 g | INTRAVENOUS | Status: DC
  Administered 2020-04-17 – 2020-04-20 (×4): 2 g via INTRAVENOUS
  Filled 2020-04-17 (×2): qty 2
  Filled 2020-04-17 (×2): qty 20
  Filled 2020-04-17 (×2): qty 2

## 2020-04-17 MED ORDER — SODIUM CHLORIDE 0.9 % IV SOLN
2.0000 g | INTRAVENOUS | Status: DC
  Filled 2020-04-17: qty 20

## 2020-04-17 MED ORDER — OXYCODONE HCL 5 MG/5ML PO SOLN
5.0000 mg | ORAL | Status: DC | PRN
  Administered 2020-04-17 – 2020-04-18 (×3): 10 mg via ORAL
  Filled 2020-04-17 (×4): qty 10

## 2020-04-17 MED ORDER — CHLORHEXIDINE GLUCONATE CLOTH 2 % EX PADS
6.0000 | MEDICATED_PAD | Freq: Once | CUTANEOUS | Status: AC
  Administered 2020-04-17: 6 via TOPICAL

## 2020-04-17 MED ORDER — MORPHINE SULFATE (PF) 2 MG/ML IV SOLN
1.0000 mg | INTRAVENOUS | Status: DC | PRN
  Administered 2020-04-17 – 2020-04-18 (×3): 2 mg via INTRAVENOUS
  Filled 2020-04-17 (×3): qty 1

## 2020-04-17 MED ORDER — SENNOSIDES-DOCUSATE SODIUM 8.6-50 MG PO TABS: 1.0000 | ORAL_TABLET | Freq: Two times a day (BID) | ORAL | Status: DC | PRN

## 2020-04-17 MED ORDER — IOHEXOL 300 MG/ML  SOLN
100.0000 mL | Freq: Once | INTRAMUSCULAR | Status: AC | PRN
  Administered 2020-04-17: 75 mL via INTRAVENOUS

## 2020-04-17 MED ORDER — METRONIDAZOLE IN NACL 5-0.79 MG/ML-% IV SOLN
500.0000 mg | Freq: Once | INTRAVENOUS | Status: AC
  Administered 2020-04-17: 500 mg via INTRAVENOUS
  Filled 2020-04-17: qty 100

## 2020-04-17 MED ORDER — POTASSIUM CHLORIDE IN NACL 20-0.9 MEQ/L-% IV SOLN
INTRAVENOUS | Status: DC
  Filled 2020-04-17 (×2): qty 1000

## 2020-04-17 MED ORDER — TRAMADOL HCL 50 MG PO TABS
50.0000 mg | ORAL_TABLET | Freq: Four times a day (QID) | ORAL | Status: DC | PRN
  Administered 2020-04-18 (×2): 50 mg via ORAL
  Filled 2020-04-17 (×2): qty 1

## 2020-04-17 MED ORDER — CHLORHEXIDINE GLUCONATE CLOTH 2 % EX PADS
6.0000 | MEDICATED_PAD | Freq: Once | CUTANEOUS | Status: AC
  Administered 2020-04-18: 6 via TOPICAL

## 2020-04-17 MED ORDER — METRONIDAZOLE IN NACL 5-0.79 MG/ML-% IV SOLN
500.0000 mg | Freq: Three times a day (TID) | INTRAVENOUS | Status: DC
  Administered 2020-04-17 – 2020-04-21 (×12): 500 mg via INTRAVENOUS
  Filled 2020-04-17 (×12): qty 100

## 2020-04-17 MED ORDER — METRONIDAZOLE IN NACL 5-0.79 MG/ML-% IV SOLN
500.0000 mg | INTRAVENOUS | Status: AC
  Administered 2020-04-18: 500 mg via INTRAVENOUS
  Filled 2020-04-17: qty 100

## 2020-04-17 MED ORDER — SODIUM CHLORIDE 0.9 % IV SOLN
2.0000 g | INTRAVENOUS | Status: AC
  Administered 2020-04-18: 2 g via INTRAVENOUS
  Filled 2020-04-17: qty 2

## 2020-04-17 MED ORDER — SODIUM CHLORIDE 0.9 % IV BOLUS
1000.0000 mL | Freq: Once | INTRAVENOUS | Status: AC
  Administered 2020-04-17: 1000 mL via INTRAVENOUS

## 2020-04-17 MED ORDER — LOSARTAN POTASSIUM 50 MG PO TABS
100.0000 mg | ORAL_TABLET | Freq: Every day | ORAL | Status: DC
  Administered 2020-04-17 – 2020-04-18 (×2): 50 mg via ORAL
  Administered 2020-04-19 – 2020-04-21 (×3): 100 mg via ORAL
  Filled 2020-04-17 (×5): qty 2

## 2020-04-17 MED ORDER — SODIUM CHLORIDE 0.9 % IV SOLN
2.0000 g | Freq: Once | INTRAVENOUS | Status: AC
  Administered 2020-04-17: 2 g via INTRAVENOUS
  Filled 2020-04-17: qty 20

## 2020-04-17 NOTE — H&P (Signed)
History and Physical    Hattie Perchric Corle UJW:[id number] DOB: 01/18/1966 DOA: 04/17/2020  PCP: Marianne Sofiaavis, Sara, PA-C [facility]  Chief Complaint: dental and facial abscess  HPI: 55yo with a history of HLD and HTN who presented to the ED with severe right-sided facial swelling related to a dental infection which had been gradually worsening over a 2-week course.  He was previously treated with Zithromax which was then changed to clindamycin for a 6 to 7-day course, none of which seemed to impact his illness.  He was seen in the ED 3/25 with a CT scan at that time showing right sided facial cellulitis and submandibular swelling but no obvious abscess.  He reported associated difficulty swallowing, subjective fevers, and swelling of the neck.  He denied swelling of the tongue lips or floor of the mouth.  Of note he reported an allergy to penicillin.  In the ER today CT of the neck revealed a right masticator and submandibular space inflammation with a new 4 x 4 x 3 cm collection involving the right pterygoid musculature wrapping around the inferior mandible with mass-effect on the oral cavity and pharynx.  Oral surgery was contacted by the ER physician and the patient was [facility] for [facility] abscess drainage.  Assessment/Plan  Right dental/facial/neck abscess with mass-effect on pharynx Oral surgery planning urgent surgery - continue empiric abx coverage (pt tolerated Rocephin dosing earlier today w/o difficulty)   Hypokalemia Likely due to poor intake - supplement - check magnesium  Uncontrolled HTN Resume Cozaar and titrate additional medications as indicated for control - acute elevation likely related to pain and therefore will avoid overaggressive correction for now - avoid diuretic use until clear patient can tolerate consistent oral intake  HLD Hold Lipitor until patient able to eat consistently   DVT prophylaxis: SCDs with oral surgery pending Code Status: Full  code Family Communication: Spoke with wife at bedside Disposition Plan: Admitted to acute units Consults called: Oral surgery Dr. Ross MarcusSherwood  Review of Systems: As per HPI otherwise 10 point review of systems negative.   Past Medical History:  Diagnosis Date   Essential (primary) hypertension    Hyperlipidemia    Hypertension    Mixed hyperlipidemia    Neuralgia and neuritis, unspecified     Past Surgical History:  Procedure Laterality Date   tosillectomy      Family History  Family History  Problem Relation Age of Onset   Lung cancer Mother    Hypertension Father    Hyperlipidemia Father    Cancer Father     Social History   reports that he quit smoking about 14 years ago. He started smoking about 39 years ago. He has a 75.00 pack-year smoking history. He has never used smokeless tobacco. He reports current alcohol use. He reports that he does not use drugs. Married w/ 3 grwon kids. Works in [employer]. Denies excessive EtOH consumption.   Allergies Allergies  Allergen Reactions   Penicillins Hives    Did it involve swelling of the face/tongue/throat, SOB, or low BP? N Did it involve sudden or severe rash/hives, skin peeling, or any reaction on the inside of your mouth or nose? Y Did you need to seek medical attention at a hospital or doctor's office? N When did it last happen?Several Years Ago If all above answers are NO, may proceed with cephalosporin use.     Prior to Admission medications   Medication Sig Start Date End Date Taking? Authorizing Provider  atorvastatin (  LIPITOR) 10 MG tablet TAKE 1 TABLET BY MOUTH EVERY DAY FOR CHOLESTEROL 03/09/20  Yes Marianne Sofia, PA-C  clindamycin (CLEOCIN) 150 MG capsule Take 150 mg by mouth as directed. Take 2 Capsules (300 mg) on Day 1 then Take 1 capsule (150 mg) Every 6 hours for Days 2-6. 04/11/20  Yes [provider]  hydrochlorothiazide (HYDRODIURIL) 25 MG tablet TAKE 1 TABLET BY MOUTH EVERY DAY  03/09/20  Yes Marianne Sofia, PA-C  losartan (COZAAR) 100 MG tablet Take 1 tablet (100 mg total) by mouth daily. Patient taking differently: Take 50 mg by mouth daily. 03/09/20  Yes Marianne Sofia, PA-C  potassium chloride (KLOR-CON) 10 MEQ tablet Take 1 tablet (10 mEq total) by mouth daily. 04/15/20  Yes Terrilee Files, MD  levocetirizine (XYZAL) 5 MG tablet Take 1 tablet (5 mg total) by mouth every evening. 05/06/18   Saguier, Ramon Dredge, PA-C  sildenafil (REVATIO) 20 MG tablet 1 tab as needed prior to sex 06/25/17   Esperanza Richters, PA-C    Physical Exam: Vitals:   04/17/20 1300 04/17/20 1400 04/17/20 1500 04/17/20 1608  BP: (!) 159/84 (!) 156/82 (!) 161/82 (!) 188/96  Pulse: 71 72 78 68  Resp: 16 18 18 19   Temp:   98.5 F (36.9 C) 98.1 F (36.7 C)  TempSrc:   Oral Oral  SpO2: 98% 97% 96% 100%  Weight:      Height:        Constitutional: NAD, calm, in obvious pain Eyes: PERRL, lids and conjunctivae normal ENMT: unable to fully open mouth - exam not duplicated as pt was just seen by Oral Surgery  Respiratory: clear to auscultation bilaterally, no wheezing, no crackles. Normal respiratory effort. No accessory muscle use.  Cardiovascular: Regular rate and rhythm, no murmurs / rubs / gallops. No extremity edema. Abdomen: overweight, no tenderness, no masses palpated. No hepatosplenomegaly. Bowel sounds positive.  Musculoskeletal: no clubbing / cyanosis. No joint deformity upper and lower extremities. Good ROM, no contractures. Normal muscle tone.  Skin: no rashes, lesions, ulcers. No induration Neurologic: CN 2-12 grossly intact. Sensation intact Psychiatric: Normal judgment and insight. Alert and oriented x 3. Normal mood.    Labs on Admission:   CBC: Recent Labs  Lab 04/15/20 1808 04/17/20 1015  WBC 17.6* 13.5*  NEUTROABS 15.7* 9.9*  HGB 15.2 13.8  HCT 41.5 38.3*  MCV 83.3 84.9  PLT 241 248   Basic Metabolic Panel: Recent Labs  Lab 04/15/20 1808 04/17/20 1015  NA 132* 137   K 2.5* 2.9*  CL 87* 95*  CO2 29 29  GLUCOSE 146* 125*  BUN 14 20  CREATININE 1.57* 1.11  CALCIUM 9.5 9.3   GFR: Estimated Creatinine Clearance: 101.2 mL/min (by C-G formula based on SCr of 1.11 mg/dL).   Urine analysis:    Component Value Date/Time   COLORURINE YELLOW 06/25/2017 0929   APPEARANCEUR Cloudy (A) 06/25/2017 0929   LABSPEC 1.025 06/25/2017 0929   PHURINE 5.0 06/25/2017 0929   GLUCOSEU NEGATIVE 06/25/2017 0929   HGBUR SMALL (A) 06/25/2017 0929   BILIRUBINUR NEGATIVE 06/25/2017 0929   KETONESUR NEGATIVE 06/25/2017 0929   UROBILINOGEN 0.2 06/25/2017 0929   NITRITE NEGATIVE 06/25/2017 0929   LEUKOCYTESUR NEGATIVE 06/25/2017 0929    Recent Results (from the past 240 hour(s))  Culture, blood (routine x 2)     Status: None (Preliminary result)   Collection Time: 04/15/20  6:00 PM   Specimen: BLOOD RIGHT FOREARM  Result Value Ref Range Status   Specimen Description  Final    BLOOD RIGHT FOREARM BLOOD Performed at [facility], [address] [place], [place] [address]    Special Requests   Final    BOTTLES DRAWN AEROBIC AND ANAEROBIC Blood Culture adequate volume Performed at [facility], [address] [place], [place] [address]    Culture   Final    NO GROWTH 1 DAY Performed at [facility], [address], [place], [place] [address]    Report Status PENDING  Incomplete  Culture, blood (routine x 2)     Status: None (Preliminary result)   Collection Time: 04/15/20  6:15 PM   Specimen: Right Antecubital; Blood  Result Value Ref Range Status   Specimen Description   Final    RIGHT ANTECUBITAL BLOOD Performed at [facility], [address] [place], [place] [address]    Special Requests AEROBIC BOTTLE ONLY Blood Culture adequate volume  Final   Culture   Final    NO GROWTH 1 DAY Performed at [facility], [address] [place], [place] [address]    Report Status PENDING  Incomplete  Resp Panel by  RT-PCR (Flu A&B, Covid) Nasopharyngeal Swab     Status: None   Collection Time: 04/17/20 10:15 AM   Specimen: Nasopharyngeal Swab; Nasopharyngeal(NP) swabs in vial transport medium  Result Value Ref Range Status   SARS Coronavirus 2 by RT PCR NEGATIVE NEGATIVE Final    Comment: (NOTE) SARS-CoV-2 target nucleic acids are NOT DETECTED.  The SARS-CoV-2 RNA is generally detectable in upper respiratory specimens during the acute phase of infection. The lowest concentration of SARS-CoV-2 viral copies this assay can detect is 138 copies/mL. A negative result does not preclude SARS-Cov-2 infection and should not be used as the sole basis for treatment or other patient management decisions. A negative result may occur with  improper specimen collection/handling, submission of specimen other than nasopharyngeal swab, presence of viral mutation(s) within the areas targeted by this assay, and inadequate number of viral copies(<138 copies/mL). A negative result must be combined with clinical observations, patient history, and epidemiological information. The expected result is Negative.  Fact Sheet for Patients:  BloggerCourse.com  Fact Sheet for Healthcare Providers:  SeriousBroker.it  This test is no t yet approved or cleared by the [place] FDA and  has been authorized for detection and/or diagnosis of SARS-CoV-2 by FDA under an Emergency Use Authorization (EUA). This EUA will remain  in effect (meaning this test can be used) for the duration of the COVID-19 declaration under Section 564(b)(1) of the Act, 21 U.S.C.section 360bbb-3(b)(1), unless the authorization is terminated  or revoked sooner.       Influenza A by PCR NEGATIVE NEGATIVE Final   Influenza B by PCR NEGATIVE NEGATIVE Final    Comment: (NOTE) The Xpert Xpress SARS-CoV-2/FLU/RSV plus assay is intended as an aid in the diagnosis of influenza from Nasopharyngeal swab  specimens and should not be used as a sole basis for treatment. Nasal washings and aspirates are unacceptable for Xpert Xpress SARS-CoV-2/FLU/RSV testing.  Fact Sheet for Patients: BloggerCourse.com  Fact Sheet for Healthcare Providers: SeriousBroker.it  This test is not yet approved or cleared by the [place] FDA and has been authorized for detection and/or diagnosis of SARS-CoV-2 by FDA under an Emergency Use Authorization (EUA). This EUA will remain in effect (meaning this test can be used) for the duration of the COVID-19 declaration under Section 564(b)(1) of the Act,  21 U.S.C. section 360bbb-3(b)(1), unless the authorization is terminated or revoked.  Performed at [facility], [address] [place], [place] [address]      Radiological Exams on Admission: CT Soft Tissue Neck W Contrast  Result Date: 04/17/2020 CLINICAL DATA:  Facial swelling.  Tooth pain EXAM: CT NECK WITH CONTRAST TECHNIQUE: Multidetector CT imaging of the neck was performed using the standard protocol following the bolus administration of intravenous contrast. CONTRAST:  38mL OMNIPAQUE IOHEXOL 300 MG/ML  SOLN COMPARISON:  Two days ago FINDINGS: Pharynx and larynx: Mass effect on the upper pharynx due to masticator space inflammation. No pharyngeal or laryngeal based mucosal inflammatory or masslike finding. Salivary glands: Edematous appearance around the mildly enlarged right submandibular gland which appears secondary. Thyroid: Negative Lymph nodes: Adenitis in the upper and lateral right neck. No nodal cavitation. Vascular: No major vessel occlusion/thrombosis. Limited intracranial: Negative Visualized orbits: Negative Mastoids and visualized paranasal sinuses: Clear Skeleton: In the setting of dental pain there is a presumably odontogenic inflammatory process involving the right masticator and submandibular spaces with interval evolution to a  rim enhancing collection expanding right pterygoid musculature and wrapping around the inferior mandible at the body and angle. The low-density area measures up to 4 x 3 x 4 cm and causes mass effect on the oral cavity and upper pharynx. IMPRESSION: Evolving right masticator and submandibular space inflammation, odontogenic by history. There is now a 4 x 4 x 3 cm collection involving the right pterygoid musculature and wrapping around the inferior mandible, with mass effect on the oral cavity and pharynx. Electronically Signed   By: Marnee Spring M.D.   On: 04/17/2020 11:38   CT Soft Tissue Neck W Contrast  Result Date: 04/15/2020 CLINICAL DATA:  Right lower molar dental abscess EXAM: CT NECK WITH CONTRAST TECHNIQUE: Multidetector CT imaging of the neck was performed using the standard protocol following the bolus administration of intravenous contrast. CONTRAST:  49mL OMNIPAQUE IOHEXOL 300 MG/ML  SOLN COMPARISON:  None. FINDINGS: Pharynx and larynx: Unremarkable.  No mass or swelling. Salivary glands: Partial fatty replacement of the parotids. Left submandibular gland is unremarkable. Right submandibular gland is surrounded by inflammatory changes and appears relatively enlarged. Thyroid: Normal. Lymph nodes: Asymmetric likely reactive nonenlarged right cervical lymph nodes in the suprahyoid neck. Vascular: Major neck vessels are patent. Trace calcified plaque at the common carotid bifurcations. Limited intracranial: No abnormal enhancement. Visualized orbits: Unremarkable. Mastoids and visualized paranasal sinuses: Minor mucosal thickening. Mastoid air cells are clear. Skeleton: Mild cervical spine degenerative changes. Upper chest: Included upper lungs are clear. Other: Infiltration of the fat lower right face and submandibular space extending along the lingual surface of the mandible. No evidence of abscess. IMPRESSION: Right lower facial cellulitis with involvement of the submandibular space and lingual  surface of the mandible. No soft tissue abscess. The submandibular gland is primarily or secondarily involved. There is no periapical lucency. Electronically Signed   By: Guadlupe Spanish M.D.   On: 04/15/2020 20:27     Lonia Blood, MD [facility] Office  [phone] Pager - Text Page per Amion as per below:  On-Call/Text Page:      [email or web address]  If 7PM-7AM, please contact night-coverage [email or web address] 04/17/2020, 4:53 PM

## 2020-04-17 NOTE — Progress Notes (Signed)
Pt arrived to room 6N29 from [facility]. Received report from Ada, Charity fundraiser. See assessment. Will continue to monitor.

## 2020-04-17 NOTE — Plan of Care (Signed)

## 2020-04-17 NOTE — ED Triage Notes (Signed)
Pt had a right side dental infection and was started on a Z-pack. His orthodontist gave him clindamycin. Was seen here 3/25 and was given steroids and abx. Pt felt fine when discharged and swelling went down but overnight his swelling came back and 9/10 pain.

## 2020-04-17 NOTE — ED Provider Notes (Signed)
[facility] EMERGENCY DEPARTMENT Provider Note   CSN: [id number] Arrival date & time: 04/17/20  7371     History Chief Complaint  Patient presents with  . Cellulitis    Tim Welch is a 55 y.o. male.  The history is provided by the patient.  Illness Location:  Face Severity:  Moderate Onset quality:  Gradual Duration:  1 week Timing:  Constant Progression:  Unchanged Chronicity:  New Context:  Patient has been on clindamycin for 6-7 days without improvement of facial swelling and right lower facial pain. Still issues with opening mouth and eat and drinking. No drooling or neck pain Relieved by:  Nothing Worsened by:  Nothing Associated symptoms: no abdominal pain, no chest pain, no cough, no ear pain, no fever, no rash, no shortness of breath, no sore throat and no vomiting        Past Medical History:  Diagnosis Date  . Essential (primary) hypertension   . Hyperlipidemia   . Hypertension   . Mixed hyperlipidemia   . Neuralgia and neuritis, unspecified     Patient Active Problem List   Diagnosis Date Noted  . COVID 12/25/2019  . Pain in jaw not originating in temporomandibular joint 09/14/2019  . Essential (primary) hypertension   . Mixed hyperlipidemia   . Atrial premature depolarization 10/23/2013  . Familial hypercholesterolemia 10/23/2013  . Erectile dysfunction due to arterial insufficiency 10/22/2013  . Family history of malignant neoplasm of prostate 10/22/2013    Past Surgical History:  Procedure Laterality Date  . tosillectomy         Family History  Problem Relation Age of Onset  . Lung cancer Mother   . Hypertension Father   . Hyperlipidemia Father   . Cancer Father     Social History   Tobacco Use  . Smoking status: Former Smoker    Packs/day: 3.00    Years: 25.00    Pack years: 75.00    Start date: 63    Quit date: 2008    Years since quitting: 14.2  . Smokeless tobacco: Never Used  Vaping Use  . Vaping Use: Never  used  Substance Use Topics  . Alcohol use: Yes    Comment: Drinks alcohol on a social basis.  . Drug use: No    Home Medications Prior to Admission medications   Medication Sig Start Date End Date Taking? Authorizing Provider  atorvastatin (LIPITOR) 10 MG tablet TAKE 1 TABLET BY MOUTH EVERY DAY FOR CHOLESTEROL 03/09/20  Yes Marianne Sofia, PA-C  hydrochlorothiazide (HYDRODIURIL) 25 MG tablet TAKE 1 TABLET BY MOUTH EVERY DAY 03/09/20  Yes Marianne Sofia, PA-C  losartan (COZAAR) 100 MG tablet Take 1 tablet (100 mg total) by mouth daily. 03/09/20  Yes Marianne Sofia, PA-C  levocetirizine (XYZAL) 5 MG tablet Take 1 tablet (5 mg total) by mouth every evening. 05/06/18   Saguier, Ramon Dredge, PA-C  potassium chloride (KLOR-CON) 10 MEQ tablet Take 1 tablet (10 mEq total) by mouth daily. 04/15/20   Terrilee Files, MD  sildenafil (REVATIO) 20 MG tablet 1 tab as needed prior to sex 06/25/17   Saguier, Ramon Dredge, PA-C    Allergies    Penicillins  Review of Systems   Review of Systems  Constitutional: Negative for chills and fever.  HENT: Positive for dental problem, facial swelling and trouble swallowing. Negative for drooling, ear pain and sore throat.        Right lower jaw pain   Eyes: Negative for pain and visual disturbance.  Respiratory: Negative for cough and shortness of breath.   Cardiovascular: Negative for chest pain and palpitations.  Gastrointestinal: Negative for abdominal pain and vomiting.  Genitourinary: Negative for dysuria and hematuria.  Musculoskeletal: Negative for arthralgias, back pain and neck pain.  Skin: Positive for color change. Negative for rash.  Neurological: Negative for seizures and syncope.  All other systems reviewed and are negative.   Physical Exam Updated Vital Signs  ED Triage Vitals  Enc Vitals Group     BP 04/17/20 0933 140/82     Pulse Rate 04/17/20 0933 80     Resp 04/17/20 0933 20     Temp 04/17/20 0933 98.9 F (37.2 C)     Temp Source 04/17/20 0933 Oral      SpO2 04/17/20 0933 98 %     Weight 04/17/20 0934 265 lb (120.2 kg)     Height 04/17/20 0934 6\' 2"  (1.88 m)     Head Circumference --      Peak Flow --      Pain Score 04/17/20 0934 9     Pain Loc --      Pain Edu? --      Excl. in GC? --      Physical Exam Vitals and nursing note reviewed.  Constitutional:      General: He is not in acute distress.    Appearance: He is well-developed. He is not ill-appearing.  HENT:     Head: Normocephalic and atraumatic.     Comments: No stridor, appears to have some trismus is only able to open up his mouth by 2 finger with, there is no tongue swelling, some poor dentition, no swelling of the lips, the floor of the mouth is soft and the tongue is not raised, there is some submandibular swelling both on the right and the left but no overlying warmth or erythema, tender in the right TMJ area    Nose: Nose normal.     Mouth/Throat:     Mouth: Mucous membranes are moist.  Eyes:     Extraocular Movements: Extraocular movements intact.     Conjunctiva/sclera: Conjunctivae normal.     Pupils: Pupils are equal, round, and reactive to light.  Cardiovascular:     Rate and Rhythm: Normal rate and regular rhythm.     Pulses: Normal pulses.     Heart sounds: No murmur heard.   Pulmonary:     Effort: Pulmonary effort is normal. No respiratory distress.     Breath sounds: Normal breath sounds.  Abdominal:     Palpations: Abdomen is soft.     Tenderness: There is no abdominal tenderness.  Musculoskeletal:     Cervical back: Normal range of motion and neck supple. No tenderness.  Skin:    General: Skin is warm and dry.     Capillary Refill: Capillary refill takes less than 2 seconds.  Neurological:     General: No focal deficit present.     Mental Status: He is alert.     ED Results / Procedures / Treatments   Labs (all labs ordered are listed, but only abnormal results are displayed) Labs Reviewed  CBC WITH DIFFERENTIAL/PLATELET -  Abnormal; Notable for the following components:      Result Value   WBC 13.5 (*)    HCT 38.3 (*)    Neutro Abs 9.9 (*)    Monocytes Absolute 1.4 (*)    Abs Immature Granulocytes 0.16 (*)    All other components within normal  limits  BASIC METABOLIC PANEL - Abnormal; Notable for the following components:   Potassium 2.9 (*)    Chloride 95 (*)    Glucose, Bld 125 (*)    All other components within normal limits  RESP PANEL BY RT-PCR (FLU A&B, COVID) ARPGX2  CULTURE, BLOOD (SINGLE)  LACTIC ACID, PLASMA    EKG None  Radiology CT Soft Tissue Neck W Contrast  Result Date: 04/17/2020 CLINICAL DATA:  Facial swelling.  Tooth pain EXAM: CT NECK WITH CONTRAST TECHNIQUE: Multidetector CT imaging of the neck was performed using the standard protocol following the bolus administration of intravenous contrast. CONTRAST:  7mL OMNIPAQUE IOHEXOL 300 MG/ML  SOLN COMPARISON:  Two days ago FINDINGS: Pharynx and larynx: Mass effect on the upper pharynx due to masticator space inflammation. No pharyngeal or laryngeal based mucosal inflammatory or masslike finding. Salivary glands: Edematous appearance around the mildly enlarged right submandibular gland which appears secondary. Thyroid: Negative Lymph nodes: Adenitis in the upper and lateral right neck. No nodal cavitation. Vascular: No major vessel occlusion/thrombosis. Limited intracranial: Negative Visualized orbits: Negative Mastoids and visualized paranasal sinuses: Clear Skeleton: In the setting of dental pain there is a presumably odontogenic inflammatory process involving the right masticator and submandibular spaces with interval evolution to a rim enhancing collection expanding right pterygoid musculature and wrapping around the inferior mandible at the body and angle. The low-density area measures up to 4 x 3 x 4 cm and causes mass effect on the oral cavity and upper pharynx. IMPRESSION: Evolving right masticator and submandibular space inflammation,  odontogenic by history. There is now a 4 x 4 x 3 cm collection involving the right pterygoid musculature and wrapping around the inferior mandible, with mass effect on the oral cavity and pharynx. Electronically Signed   By: Marnee Spring M.D.   On: 04/17/2020 11:38   CT Soft Tissue Neck W Contrast  Result Date: 04/15/2020 CLINICAL DATA:  Right lower molar dental abscess EXAM: CT NECK WITH CONTRAST TECHNIQUE: Multidetector CT imaging of the neck was performed using the standard protocol following the bolus administration of intravenous contrast. CONTRAST:  49mL OMNIPAQUE IOHEXOL 300 MG/ML  SOLN COMPARISON:  None. FINDINGS: Pharynx and larynx: Unremarkable.  No mass or swelling. Salivary glands: Partial fatty replacement of the parotids. Left submandibular gland is unremarkable. Right submandibular gland is surrounded by inflammatory changes and appears relatively enlarged. Thyroid: Normal. Lymph nodes: Asymmetric likely reactive nonenlarged right cervical lymph nodes in the suprahyoid neck. Vascular: Major neck vessels are patent. Trace calcified plaque at the common carotid bifurcations. Limited intracranial: No abnormal enhancement. Visualized orbits: Unremarkable. Mastoids and visualized paranasal sinuses: Minor mucosal thickening. Mastoid air cells are clear. Skeleton: Mild cervical spine degenerative changes. Upper chest: Included upper lungs are clear. Other: Infiltration of the fat lower right face and submandibular space extending along the lingual surface of the mandible. No evidence of abscess. IMPRESSION: Right lower facial cellulitis with involvement of the submandibular space and lingual surface of the mandible. No soft tissue abscess. The submandibular gland is primarily or secondarily involved. There is no periapical lucency. Electronically Signed   By: Guadlupe Spanish M.D.   On: 04/15/2020 20:27    Procedures Procedures   Medications Ordered in ED Medications  sodium chloride 0.9 % bolus  1,000 mL (0 mLs Intravenous Stopped 04/17/20 1116)  fentaNYL (SUBLIMAZE) injection 50 mcg (50 mcg Intravenous Given 04/17/20 1011)  cefTRIAXone (ROCEPHIN) 2 g in sodium chloride 0.9 % 100 mL IVPB (0 g Intravenous Stopped 04/17/20 1108)  metroNIDAZOLE (FLAGYL) IVPB 500 mg (0 mg Intravenous Stopped 04/17/20 1324)  iohexol (OMNIPAQUE) 300 MG/ML solution 100 mL (75 mLs Intravenous Contrast Given 04/17/20 1054)  fentaNYL (SUBLIMAZE) injection 50 mcg (50 mcg Intravenous Given 04/17/20 1343)    ED Course  I have reviewed the triage vital signs and the nursing notes.  Pertinent labs & imaging results that were available during my care of the patient were reviewed by me and considered in my medical decision making (see chart for details).    MDM Rules/Calculators/A&P                          Hattie Perchric Folz is a 55 year old male with history of high cholesterol, hypertension who presents the ED with ongoing facial swelling.  Has had pain in the right side of his face and jaw for about the last 2 weeks.  Had a course of Zithromax without much improvement.  Has been on clindamycin for about 6 to 7 days with not much improvement.  Was seen here 2 days ago in the ED for the same had a CT scan that showed a right sided facial cellulitis and submandibular swelling.  There is no obvious abscess.  Patient still has difficulty opening his mouth and still having facial swelling and neck swelling.  He has had subjective fevers.  He has about 2 finger trismus on exam which appears to be consistent with what he was 2 days ago.  He is still having difficulty swallowing.  He still has some swelling to the anterior portion of the neck.  No overlying redness.  Has some poor dentition.  He is tender over the right side of his jaw.  Does not appear to have much swelling of the tongue, lips, floor of the mouth.  Suspect that he is not doing well with clindamycin.  He has been on 300 mg every 6.  Talked with pharmacy and will give doses of  IV Flagyl and IV Rocephin.  He is allergic to penicillin with hives and will try Rocephin over Unasyn.  We will recheck labs and get another CT scan of his face.  Suspect observation stay for IV antibiotics is going to be warranted.  Will touch base with any appropriate specialties if needed.  It did not appear to be a dental source on prior CT scan but suspect likely dental source.  Patient with 4 x 4 x 3 cm collection involving the right pterygoid muscle and inferior mandible.  Likely dental source.  Talked with Dr. Ross MarcusSherwood with oral surgery who [facility].  Will need to drain abscess.  Will keep n.p.o. for now in case he can taken to the OR tonight.  Patient admitted to medicine in stable condition.  No concern for sepsis at this time.  This chart was dictated using voice recognition software.  Despite best efforts to proofread,  errors can occur which can change the documentation meaning.    Final Clinical Impression(s) / ED Diagnoses Final diagnoses:  Dental abscess  Facial abscess  Pain, dental    Rx / DC Orders ED Discharge Orders    None       Virgina NorfolkCuratolo, Adam, DO 04/17/20 1405

## 2020-04-17 NOTE — ED Notes (Signed)
Patient transported to CT 

## 2020-04-17 NOTE — Progress Notes (Signed)
Dr. Ross Marcus at bedside to see pt. Will continue to monitor.

## 2020-04-17 NOTE — ED Notes (Signed)
Pt leaving with EMS for hospital to hospital transfer [facility])

## 2020-04-17 NOTE — H&P (Signed)
H&P Infection  Exam Date:04/17/20  ID: The patient is a77 yoMwho presented with pain and swelling of the neck/face.  History of Present Illness: The patient reports having a dental pain in the lower right with tooth #31 about 1.5 weeks ago. He saw his dentist who adjusted the tooth and had interval relief however about a week ago he began to have increased pain and swelling.He has been on a course of azithromycin and erythromycin but his swelling and limited opening have gotten progressively worse. He was seen at OSH and CT scan a couple days ago showed only cellulitis. He has been on Rocephin/Flagyl and symptoms have been stable however repeat CT today showed development of fluid collection.  The patient'sis currently NPO and accompanied by his wife. Reports sore throat and trismus, denies dysphagia and dyspnea.  Clinical Exam: Extraoral Exam:  Patient is alert, orientated and in milddistress CN II-XII intact There is appreciable facial swelling of the face/neck on the right side The swelling doesextend into the neck  Intraoral Exam:  The patient doeshave trismus with maximum incisal opening of 33mm. The vestibule is slightly raised The floor of mouth is slightly raised/edematous/erythematous on the right side The tongue is slightlyelevated. No palatal draping, uvula is midline  No obvious gross caries; #31 has crown present  Cardiovascular: Regular rate and rhythm without any appreciable murmurs, gallops or rubs.  Respiratory: Lungs were clear to auscultation bilaterally without any wheezing or rhonchi. Abdomen: Non-distended   Radiographic Exam:  Panorex shows #31 with periapical radiolucency.  A CT of the larynx with contrast was obtained showing fluid  collections/cellulitis associated with the right submandibular, , sublingual, and masticator spaces. There is slight airway deviation and airway is patent.  Assessment:55 yoMpatient with a right submandibular, sublingual, and masticator space infection associated with necrotic tooth #31.  Plan: The patient will require incision and drainage of the facial abcess with extraction of teeth as necessary, including #31, in the OR. Consent will be obtained and will be scanned into EPIC.    OMFS Recommendations -Plan for the OR tomorrow 3/28 @ 14:30. Patient can have clear diet today and please make NPO @ midnight.  -Please continue Rocephin/Flagyl  -Decadron 8 mg q 12 hr x 3-4 doses -Peridex (chlorhexidine) mouthrnise QID -Daily CBC w/ diff   Risks, complications and alternatives of tooth extraction and incision and drainage procedures were discussed and questions were answered. Among all potential risks and complications, I emphasized the potential for pain, bleeding, swelling, infection, localized alveolar osteitis (dry socket), temporary and permanent lingual and inferior alveolar nerve injury, oroantral (sinus) communication, oronasal communication, jaw fracture, damage to adjacent teeth and tissue, joint discomfort, bone/tooth fragments, recurrence of infection, need for additional procedures, facial nerve injury, scarring, limited mouth opening, drain placement, aspiration and anesthetic mishap.  Herbie Saxon, DDS

## 2020-04-18 ENCOUNTER — Encounter (HOSPITAL_COMMUNITY)
Admission: EM | Disposition: A | Discharge status: Home / Self Care | Payer: Self-pay | Source: Home / Self Care | Attending: Internal Medicine

## 2020-04-18 ENCOUNTER — Inpatient Hospital Stay (HOSPITAL_COMMUNITY): Payer: No Typology Code available for payment source | Admitting: Anesthesiology

## 2020-04-18 ENCOUNTER — Encounter (HOSPITAL_COMMUNITY): Payer: Self-pay | Admitting: Internal Medicine

## 2020-04-18 HISTORY — PX: TOOTH EXTRACTION: SHX859

## 2020-04-18 LAB — COMPREHENSIVE METABOLIC PANEL
ALT: 44 U/L (ref 0–44)
AST: 22 U/L (ref 15–41)
Albumin: 3.1 g/dL — ABNORMAL LOW (ref 3.5–5.0)
Alkaline Phosphatase: 49 U/L (ref 38–126)
Anion gap: 10 (ref 5–15)
BUN: 8 mg/dL (ref 6–20)
CO2: 28 mmol/L (ref 22–32)
Calcium: 8.7 mg/dL — ABNORMAL LOW (ref 8.9–10.3)
Chloride: 99 mmol/L (ref 98–111)
Creatinine, Ser: 0.96 mg/dL (ref 0.61–1.24)
GFR, Estimated: >60 mL/min (ref >60)
Glucose, Bld: 131 mg/dL — ABNORMAL HIGH (ref 70–99)
Potassium: 3.4 mmol/L — ABNORMAL LOW (ref 3.5–5.1)
Sodium: 137 mmol/L (ref 135–145)
Total Bilirubin: 0.7 mg/dL (ref 0.3–1.2)
Total Protein: 5.9 g/dL — ABNORMAL LOW (ref 6.5–8.1)

## 2020-04-18 LAB — MAGNESIUM: Magnesium: 1.6 mg/dL — ABNORMAL LOW (ref 1.7–2.4)

## 2020-04-18 LAB — CBC
HCT: 37 % — ABNORMAL LOW (ref 39.0–52.0)
Hemoglobin: 13 g/dL (ref 13.0–17.0)
MCH: 30.7 pg (ref 26.0–34.0)
MCHC: 35.1 g/dL (ref 30.0–36.0)
MCV: 87.3 fL (ref 80.0–100.0)
Platelets: 231 10*3/uL (ref 150–400)
RBC: 4.24 MIL/uL (ref 4.22–5.81)
RDW: 12.1 % (ref 11.5–15.5)
WBC: 12.6 10*3/uL — ABNORMAL HIGH (ref 4.0–10.5)
nRBC: 0 % (ref 0.0–0.2)

## 2020-04-18 SURGERY — DENTAL RESTORATION/EXTRACTIONS
Anesthesia: General

## 2020-04-18 MED ORDER — LACTATED RINGERS IV SOLN: INTRAVENOUS | Status: DC | PRN

## 2020-04-18 MED ORDER — POTASSIUM CHLORIDE CRYS ER 20 MEQ PO TBCR: 40.0000 meq | EXTENDED_RELEASE_TABLET | Freq: Once | ORAL | Status: DC

## 2020-04-18 MED ORDER — PROPOFOL 10 MG/ML IV BOLUS
INTRAVENOUS | Status: DC | PRN
  Administered 2020-04-18: 100 mg via INTRAVENOUS

## 2020-04-18 MED ORDER — POTASSIUM CHLORIDE CRYS ER 20 MEQ PO TBCR
20.0000 meq | EXTENDED_RELEASE_TABLET | Freq: Once | ORAL | Status: AC
  Administered 2020-04-18: 20 meq via ORAL
  Filled 2020-04-18: qty 1

## 2020-04-18 MED ORDER — DEXAMETHASONE SODIUM PHOSPHATE 10 MG/ML IJ SOLN
INTRAMUSCULAR | Status: AC
  Filled 2020-04-18: qty 1

## 2020-04-18 MED ORDER — MIDAZOLAM HCL 2 MG/2ML IJ SOLN
INTRAMUSCULAR | Status: DC | PRN
  Administered 2020-04-18: 1 mg via INTRAVENOUS

## 2020-04-18 MED ORDER — PROMETHAZINE HCL 25 MG/ML IJ SOLN: 6.2500 mg | INTRAMUSCULAR | Status: DC | PRN

## 2020-04-18 MED ORDER — ROCURONIUM BROMIDE 10 MG/ML (PF) SYRINGE
PREFILLED_SYRINGE | INTRAVENOUS | Status: AC
  Filled 2020-04-18: qty 10

## 2020-04-18 MED ORDER — ONDANSETRON HCL 4 MG/2ML IJ SOLN
INTRAMUSCULAR | Status: AC
  Filled 2020-04-18: qty 2

## 2020-04-18 MED ORDER — MIDAZOLAM HCL 2 MG/2ML IJ SOLN
INTRAMUSCULAR | Status: AC
  Filled 2020-04-18: qty 2

## 2020-04-18 MED ORDER — SUGAMMADEX SODIUM 200 MG/2ML IV SOLN
INTRAVENOUS | Status: DC | PRN
  Administered 2020-04-18: 400 mg via INTRAVENOUS

## 2020-04-18 MED ORDER — LIDOCAINE-EPINEPHRINE 2 %-1:100000 IJ SOLN
INTRAMUSCULAR | Status: AC
  Filled 2020-04-18: qty 1

## 2020-04-18 MED ORDER — OXYCODONE HCL 5 MG PO TABS: 5.0000 mg | ORAL_TABLET | Freq: Once | ORAL | Status: DC | PRN

## 2020-04-18 MED ORDER — MAGNESIUM SULFATE 2 GM/50ML IV SOLN
2.0000 g | Freq: Once | INTRAVENOUS | Status: AC
  Administered 2020-04-18: 2 g via INTRAVENOUS
  Filled 2020-04-18: qty 50

## 2020-04-18 MED ORDER — 0.9 % SODIUM CHLORIDE (POUR BTL) OPTIME
TOPICAL | Status: DC | PRN
  Administered 2020-04-18: 1000 mL

## 2020-04-18 MED ORDER — DEXMEDETOMIDINE HCL 200 MCG/2ML IV SOLN: INTRAVENOUS | Status: DC | PRN

## 2020-04-18 MED ORDER — FENTANYL CITRATE (PF) 250 MCG/5ML IJ SOLN
INTRAMUSCULAR | Status: AC
  Filled 2020-04-18: qty 5

## 2020-04-18 MED ORDER — LIDOCAINE-EPINEPHRINE 2 %-1:100000 IJ SOLN
INTRAMUSCULAR | Status: DC | PRN
  Administered 2020-04-18: 5.5 mL via INTRADERMAL

## 2020-04-18 MED ORDER — FENTANYL CITRATE (PF) 100 MCG/2ML IJ SOLN: 25.0000 ug | INTRAMUSCULAR | Status: DC | PRN

## 2020-04-18 MED ORDER — OXYMETAZOLINE HCL 0.05 % NA SOLN
NASAL | Status: AC
  Filled 2020-04-18: qty 30

## 2020-04-18 MED ORDER — OXYCODONE HCL 5 MG/5ML PO SOLN: 5.0000 mg | Freq: Once | ORAL | Status: DC | PRN

## 2020-04-18 MED ORDER — DEXMEDETOMIDINE (PRECEDEX) IN NS 20 MCG/5ML (4 MCG/ML) IV SYRINGE
PREFILLED_SYRINGE | INTRAVENOUS | Status: DC | PRN
  Administered 2020-04-18: 52 ug via INTRAVENOUS
  Administered 2020-04-18: 8 ug via INTRAVENOUS

## 2020-04-18 MED ORDER — PROPOFOL 10 MG/ML IV BOLUS
INTRAVENOUS | Status: AC
  Filled 2020-04-18: qty 20

## 2020-04-18 MED ORDER — FENTANYL CITRATE (PF) 250 MCG/5ML IJ SOLN
INTRAMUSCULAR | Status: DC | PRN
  Administered 2020-04-18: 100 ug via INTRAVENOUS

## 2020-04-18 MED ORDER — LIDOCAINE HCL 4 % EX SOLN
CUTANEOUS | Status: DC | PRN
  Administered 2020-04-18: 3 mL via TOPICAL

## 2020-04-18 MED ORDER — ONDANSETRON HCL 4 MG/2ML IJ SOLN
INTRAMUSCULAR | Status: DC | PRN
  Administered 2020-04-18: 4 mg via INTRAVENOUS

## 2020-04-18 MED ORDER — LIDOCAINE 2% (20 MG/ML) 5 ML SYRINGE
INTRAMUSCULAR | Status: AC
  Filled 2020-04-18: qty 5

## 2020-04-18 MED ORDER — DEXAMETHASONE SODIUM PHOSPHATE 10 MG/ML IJ SOLN
INTRAMUSCULAR | Status: DC | PRN
  Administered 2020-04-18: 8 mg via INTRAVENOUS

## 2020-04-18 MED ORDER — ROCURONIUM BROMIDE 10 MG/ML (PF) SYRINGE
PREFILLED_SYRINGE | INTRAVENOUS | Status: DC | PRN
  Administered 2020-04-18: 50 mg via INTRAVENOUS

## 2020-04-18 MED ORDER — BUTAMBEN-TETRACAINE-BENZOCAINE 2-2-14 % EX AERO
INHALATION_SPRAY | CUTANEOUS | Status: DC | PRN
  Administered 2020-04-18: 2 via TOPICAL

## 2020-04-18 SURGICAL SUPPLY — 37 items
BLADE SURG 15 STRL LF DISP TIS (BLADE) ×1 IMPLANT
BLADE SURG 15 STRL SS (BLADE) ×1
BNDG GAUZE ELAST 4 BULKY (GAUZE/BANDAGES/DRESSINGS) ×1 IMPLANT
BUR CROSS CUT FISSURE 1.6 (BURR) ×2 IMPLANT
CANISTER SUCT 3000ML PPV (MISCELLANEOUS) ×2 IMPLANT
COVER SURGICAL LIGHT HANDLE (MISCELLANEOUS) ×2 IMPLANT
COVER WAND RF STERILE (DRAPES) IMPLANT
DECANTER SPIKE VIAL GLASS SM (MISCELLANEOUS) ×2 IMPLANT
DRAPE U-SHAPE 76X120 STRL (DRAPES) ×2 IMPLANT
GAUZE PACKING FOLDED 2  STR (GAUZE/BANDAGES/DRESSINGS) ×1
GAUZE PACKING FOLDED 2 STR (GAUZE/BANDAGES/DRESSINGS) ×1 IMPLANT
GAUZE SPONGE 4X4 12PLY STRL LF (GAUZE/BANDAGES/DRESSINGS) ×2 IMPLANT
GLOVE BIO SURGEON STRL SZ8 (GLOVE) ×2 IMPLANT
GLOVE SRG 8 PF TXTR STRL LF DI (GLOVE) IMPLANT
GLOVE SURG UNDER POLY LF SZ8 (GLOVE)
GOWN STRL REUS W/ TWL LRG LVL3 (GOWN DISPOSABLE) ×1 IMPLANT
GOWN STRL REUS W/ TWL XL LVL3 (GOWN DISPOSABLE) ×1 IMPLANT
GOWN STRL REUS W/TWL LRG LVL3 (GOWN DISPOSABLE) ×1
GOWN STRL REUS W/TWL XL LVL3 (GOWN DISPOSABLE) ×1
IV NS 1000ML (IV SOLUTION) ×1
IV NS 1000ML BAXH (IV SOLUTION) ×1 IMPLANT
KIT BASIN OR (CUSTOM PROCEDURE TRAY) ×2 IMPLANT
KIT TURNOVER KIT B (KITS) ×2 IMPLANT
NDL HYPO 25GX1X1/2 BEV (NEEDLE) ×2 IMPLANT
NEEDLE HYPO 25GX1X1/2 BEV (NEEDLE) ×2 IMPLANT
NS IRRIG 1000ML POUR BTL (IV SOLUTION) ×2 IMPLANT
PAD ARMBOARD 7.5X6 YLW CONV (MISCELLANEOUS) ×2 IMPLANT
SLEEVE IRRIGATION ELITE 7 (MISCELLANEOUS) ×1 IMPLANT
SPONGE SURGIFOAM ABS GEL 12-7 (HEMOSTASIS) IMPLANT
SUT CHROMIC 3 0 PS 2 (SUTURE) ×2 IMPLANT
SUT ETHILON 2 0 PS N (SUTURE) ×1 IMPLANT
SYR BULB IRRIG 60ML STRL (SYRINGE) ×2 IMPLANT
SYR CONTROL 10ML LL (SYRINGE) ×2 IMPLANT
TRAY ENT MC OR (CUSTOM PROCEDURE TRAY) ×2 IMPLANT
TUBE SALEM SUMP 16 FR W/ARV (TUBING) ×2 IMPLANT
TUBING IRRIGATION (MISCELLANEOUS) ×2 IMPLANT
YANKAUER SUCT BULB TIP NO VENT (SUCTIONS) ×2 IMPLANT

## 2020-04-18 NOTE — Anesthesia Preprocedure Evaluation (Addendum)
Anesthesia Evaluation  Patient identified by MRN, date of birth, ID band Patient awake    Reviewed: Allergy & Precautions, NPO status , Patient's Chart, lab work & pertinent test results  History of Anesthesia Complications Negative for: history of anesthetic complications  Airway Mallampati: IV  TM Distance: >3 FB Neck ROM: Full  Mouth opening: Limited Mouth Opening Comment: Approx 1cm mouth opening Dental no notable dental hx.    Pulmonary former smoker,    Pulmonary exam normal        Cardiovascular hypertension, Pt. on medications Normal cardiovascular exam     Neuro/Psych negative neurological ROS  negative psych ROS   GI/Hepatic negative GI ROS, Neg liver ROS,   Endo/Other  negative endocrine ROS  Renal/GU negative Renal ROS  negative genitourinary   Musculoskeletal negative musculoskeletal ROS (+)   Abdominal   Peds  Hematology negative hematology ROS (+)   Anesthesia Other Findings facial abscess  Reproductive/Obstetrics negative OB ROS                            Anesthesia Physical Anesthesia Plan  ASA: II  Anesthesia Plan: General   Post-op Pain Management:    Induction: Intravenous  PONV Risk Score and Plan: 2 and Treatment may vary due to age or medical condition, Ondansetron, Dexamethasone and Midazolam  Airway Management Planned: Nasal ETT, Fiberoptic Intubation Planned and Awake Intubation Planned  Additional Equipment: None  Intra-op Plan:   Post-operative Plan: Extubation in OR  Informed Consent: I have reviewed the patients History and Physical, chart, labs and discussed the procedure including the risks, benefits and alternatives for the proposed anesthesia with the patient or authorized representative who has indicated his/her understanding and acceptance.     Dental advisory given  Plan Discussed with: CRNA  Anesthesia Plan Comments: (Awake nasal  fiberoptic intubation planned due to very limited mouth opening. Stephannie Peters, MD)       Anesthesia Quick Evaluation

## 2020-04-18 NOTE — Brief Op Note (Addendum)
PRE-OPERATIVE DIAGNOSIS:Right submandibular, sublingual, and masticator space infection with necrotic tooth #32 POST-OPERATIVE DIAGNOSIS: Same   SURGEON: Surgeon(s) and Role: * Jencarlos Nicolson J, DMD - Primary  Procedure: Surgical removal #31([id number] x 1) Extra-oral I&D of sublingual abscess (41015) Extra-oral I&D of submandibular abscess (41017) Extra-oral I&D of masticator space abscess (41018)  Description of Operation/Procedure:  [facility]. General anesthesia was induced and an nasalendotracheal tube was secured in the standard fashion. The table was moved slightly away from anesthesia and the patient was properly padded, relieving all pressure points. A formal time-out was executed. 2% lidocaine with 1:100,000 epinephrine was infiltrated into the proposed surgical sites. The patient was prepped and draped in the standard sterile fashion and a throat pack was placed.   Using an 18 gauge needle, aspiration of the swelling was performed via an extraoral approach, yielding purulence. This was sent for aerobic and anaerobic cultures and a stat Gram stain.  Attention was then directed intraorally to thelower right. A full thickness mucoperiosteal flap was elevated on both the buccal and lingual. Teeth #31were then removed with standard elevator and forceps technique without complication. The extraction sites were thoroughly curetted, bone was smoothed, and the sites thoroughly irrigated.  Transcutaneous drainage sites were then carefully marked beneath the inferior border of the mandible to allow dependent drainage, avoid neurovascular structures, and to promote esthetics. Incisions were made through skin and subcutaneous tissues and hemostasis was obtained. The platysma was identified and divided. Blunt dissection was carried to the mandible with intraoral digital guidance. The following spaces were bluntly explored  yielding purulent drainage: submandibular, sublingual, submental, masticator, and lateral pharyngeal.  Intraoral access was used to assure proper 1/4'"Penrose drain placement into each fascial space. A total of 2drains were placed and secured with 3-0 nylon sutures in the standard fashion to allow for post-surgical advancement when appropriate. All areas were thoroughly irrigated. The full thickness mucoperiosteal flap was reapproximated with 3-0 chromic gut suture. A burn net dressing was fashioned to secure kerlix fluffs over the extraoral drains.  The oral cavity was suctioned free of all debris and secretions. The teeth were brushed. The throat pack was removed and an orogastric tube was passed to evacuate the stomach contents. Sponge and needle counts were correct x 2. Care of the patient was turned over to the Anesthesia team for uneventful extubation and delivery of the patient to the PACU in stable condition.  ANESTHESIA: general EBL: 50 mL  DRAINS: Penrose drain in therightneck X 2 LOCAL MEDICATIONS USED: LIDOCAINE 2% w/ 1:100000 epi SPECIMEN: Aspirate - cultures for anaerobes, aerobes, and fungal PLAN OF CARE: Return to floor PATIENT DISPOSITION: PACU - hemodynamically stable. Delay start of Pharmacological VTE agent (>24hrs) due to surgical blood loss or risk of bleeding: no  OMFS Recommendations -Liquid diet today, advance to soft mechanical diet tomorrow -Peridex (chlorhexidine) mouthrnise QID -Change Kerlix dressing as needed -ContinueCeftriaxone/Flagyl -Follow cultures -Daily CBC w/ diff 

## 2020-04-18 NOTE — Anesthesia Postprocedure Evaluation (Signed)
Anesthesia Post Note  Patient: Tim Welch  Procedure(s) Performed: INCISION AND DRAINAGE OF FACIAL ABSCESS AND EXTRACTION OF TOOTH #31     Patient location during evaluation: PACU Anesthesia Type: General Level of consciousness: awake and alert and oriented Pain management: pain level controlled Vital Signs Assessment: post-procedure vital signs reviewed and stable Respiratory status: spontaneous breathing, nonlabored ventilation and respiratory function stable Cardiovascular status: blood pressure returned to baseline Postop Assessment: no apparent nausea or vomiting Anesthetic complications: no   No complications documented.  Last Vitals:  Vitals:   04/18/20 1645 04/18/20 1712  BP: (!) 152/75 (!) 168/79  Pulse: 73 70  Resp: 19   Temp: 36.5 C 37.1 C  SpO2: 92% 96%    Last Pain:  Vitals:   04/18/20 1712  TempSrc: Oral  PainSc:                  Kaylyn Layer

## 2020-04-18 NOTE — Anesthesia Procedure Notes (Addendum)
Procedure Name: Awake intubation Date/Time: 04/18/2020 3:03 PM Performed by: Kaylyn Layer, MD Pre-anesthesia Checklist: Patient identified, Emergency Drugs available, Suction available and Patient being monitored Patient Re-evaluated:Patient Re-evaluated prior to induction Oxygen Delivery Method: Circle system utilized Preoxygenation: Pre-oxygenation with 100% oxygen Induction Type: IV induction Nasal Tubes: Nasal prep performed and Nasal Rae Tube size: 7.0 mm Number of attempts: 1 Airway Equipment and Method: Fiberoptic brochoscope Placement Confirmation: ETT inserted through vocal cords under direct vision,  positive ETCO2 and breath sounds checked- equal and bilateral Tube secured with: Tape Dental Injury: Teeth and Oropharynx as per pre-operative assessment  Difficulty Due To: Difficult Airway- due to limited oral opening Comments: Awake nasal intubation performed due to trismus, oral opening approximately 1cm. Nebulized 4% lidocaine 3cc given in preop and enroute to OR. Nasal Afrin 2 sprays per nare administered. Benzocaine spray used to topicalize posterior oropharynx. Nasal trumpet (cut longitudinally) with lidiocaine inserted into left nare. Fiberoptic scope inserted through nasal trumpet and nasal trumpet removed. ETT advanced atraumatically. 1cc 2% lidocaine sprayed on cords prior to advancement of ETT. EtCO2 confirmed prior to IV induction. +BBS. VSS throughout. Stephannie Peters, MD

## 2020-04-18 NOTE — Transfer of Care (Signed)
Immediate Anesthesia Transfer of Care Note  Patient: Tim Welch  Procedure(s) Performed: INCISION AND DRAINAGE OF FACIAL ABSCESS AND EXTRACTION OF TOOTH #31  Patient Location: PACU  Anesthesia Type:General  Level of Consciousness: awake, alert  and oriented  Airway & Oxygen Therapy: Patient Spontanous Breathing and Patient connected to face mask oxygen  Post-op Assessment: Report given to RN and Post -op Vital signs reviewed and stable  Post vital signs: Reviewed and stable  Last Vitals:  Vitals Value Taken Time  BP    Temp    Pulse 76 04/18/20 1558  Resp 18 04/18/20 1558  SpO2 100 % 04/18/20 1558  Vitals shown include unvalidated device data.  Last Pain:  Vitals:   04/18/20 1313  TempSrc: Oral  PainSc:          Complications: No complications documented.

## 2020-04-18 NOTE — Progress Notes (Signed)
Tim Welch  LKG:[id number] DOB: 1965/04/08 DOA: 04/17/2020 PCP: Marianne Sofia, PA-C    Brief Narrative:  [phone] with a hx of HLD and HTN who presented to the ED with severe right-sided facial swelling related to a dental infection which had been gradually worsening over a 2-week course.  He was previously treated with Zithromax then clindamycin w/o imrovement. In the ER CT of the neck revealed a right masticator and submandibular space inflammation with a new 4 x 4 x 3 cm collection involving the right pterygoid musculature wrapping around the inferior mandible with mass-effect on the oral cavity and pharynx.    Antimicrobials:  Rocephin 3/27 > Flagyl 3/27 >  DVT prophylaxis: SCDs  Consultants:  Oral Surgery  Code Status: FULL CODE  Subjective: Afebrile.  Blood pressure only mildly elevated.  Saturations stable.  Potassium improving but not yet at goal.  Magnesium found to be deficient. Taken to the OR for tooth extraction and drainage of his abscess today.   Assessment & Plan:  Right dental/facial/neck abscess with mass-effect on pharynx continue empiric abx coverage - care per Oral Surgery   Hypokalemia Likely due to poor intake - continue to supplement  Hypomagnesemia Replace to goal of 2.0  Uncontrolled HTN Continue Cozaar - avoid diuretic use until clear patient can tolerate consistent oral intake  HLD Hold Lipitor until patient able to eat consistently   Family Communication:  Status is: Inpatient  Remains inpatient appropriate because:Inpatient level of care appropriate due to severity of illness   Dispo: The patient is from: Home              Anticipated d/c is to: Home              Patient currently is not medically stable to d/c.   Difficult to place patient No   Objective: Blood pressure (!) 168/79, pulse 70, temperature 98.8 F (37.1 C), temperature source Oral, resp. rate 19, height 6\' 2"  (1.88 m), weight 120.2 kg, SpO2 96 %.  Intake/Output Summary  (Last 24 hours) at 04/18/2020 1718 Last data filed at 04/18/2020 1550 Gross per 24 hour  Intake 1683.01 ml  Output 620 ml  Net 1063.01 ml   Filed Weights   04/17/20 0934  Weight: 120.2 kg    Examination: Pt in OR when attempted to visit for exam  CBC: Recent Labs  Lab 04/15/20 1808 04/17/20 1015 04/18/20 0042  WBC 17.6* 13.5* 12.6*  NEUTROABS 15.7* 9.9*  --   HGB 15.2 13.8 13.0  HCT 41.5 38.3* 37.0*  MCV 83.3 84.9 87.3  PLT 241 248 231   Basic Metabolic Panel: Recent Labs  Lab 04/15/20 1808 04/17/20 1015 04/18/20 0042  NA 132* 137 137  K 2.5* 2.9* 3.4*  CL 87* 95* 99  CO2 29 29 28   GLUCOSE 146* 125* 131*  BUN 14 20 8   CREATININE 1.57* 1.11 0.96  CALCIUM 9.5 9.3 8.7*  MG  --   --  1.6*   GFR: Estimated Creatinine Clearance: 117 mL/min (by C-G formula based on SCr of 0.96 mg/dL).  Liver Function Tests: Recent Labs  Lab 04/18/20 0042  AST 22  ALT 44  ALKPHOS 49  BILITOT 0.7  PROT 5.9*  ALBUMIN 3.1*     Recent Results (from the past 240 hour(s))  Culture, blood (routine x 2)     Status: None (Preliminary result)   Collection Time: 04/15/20  6:00 PM   Specimen: BLOOD RIGHT FOREARM  Result Value Ref Range Status  Specimen Description   Final    BLOOD RIGHT FOREARM BLOOD Performed at [facility], [address] [place], [place] [address]    Special Requests   Final    BOTTLES DRAWN AEROBIC AND ANAEROBIC Blood Culture adequate volume Performed at [facility], [address] [place], [place] [address]    Culture   Final    NO GROWTH 2 DAYS Performed at [facility], [address], [place], [place] [address]    Report Status PENDING  Incomplete  Culture, blood (routine x 2)     Status: None (Preliminary result)   Collection Time: 04/15/20  6:15 PM   Specimen: Right Antecubital; Blood  Result Value Ref Range Status   Specimen Description   Final    RIGHT ANTECUBITAL BLOOD Performed at [facility], [address] [place], [place] [address]    Special Requests AEROBIC BOTTLE ONLY Blood Culture adequate volume  Final   Culture   Final    NO GROWTH 2 DAYS Performed at [facility], [address] [place], [place] [address]    Report Status PENDING  Incomplete  Culture, blood (single)     Status: None (Preliminary result)   Collection Time: 04/17/20 10:15 AM   Specimen: BLOOD  Result Value Ref Range Status   Specimen Description   Final    BLOOD RIGHT ANTECUBITAL Performed at [facility], [address] [place], [place] [address]    Special Requests   Final    BOTTLES DRAWN AEROBIC AND ANAEROBIC Blood Culture adequate volume Performed at [facility], [address] [place], [place] [address]    Culture   Final    NO GROWTH < 24 HOURS Performed at [facility], [address] [place], [place] [address]    Report Status PENDING  Incomplete  Resp Panel by RT-PCR (Flu A&B, Covid) Nasopharyngeal Swab     Status: None   Collection Time: 04/17/20 10:15 AM   Specimen: Nasopharyngeal Swab; Nasopharyngeal(NP) swabs in vial transport medium  Result Value Ref Range Status   SARS Coronavirus 2 by RT PCR NEGATIVE NEGATIVE Final    Comment: (NOTE) SARS-CoV-2 target nucleic acids are NOT DETECTED.  The SARS-CoV-2 RNA is generally detectable in upper respiratory specimens during the acute phase of infection. The lowest concentration of SARS-CoV-2 viral copies this assay can detect is 138 copies/mL. A negative result does not preclude SARS-Cov-2 infection and should not be used as the sole basis for treatment or other patient management decisions. A negative result may occur with  improper specimen collection/handling, submission of specimen other than nasopharyngeal swab, presence of viral mutation(s) within the areas targeted by this assay, and inadequate number of viral copies(<138 copies/mL). A negative result must be combined with clinical  observations, patient history, and epidemiological information. The expected result is Negative.  Fact Sheet for Patients:  BloggerCourse.com  Fact Sheet for Healthcare Providers:  SeriousBroker.it  This test is no t yet approved or cleared by the [place] FDA and  has been authorized for detection and/or diagnosis of SARS-CoV-2 by FDA under an Emergency Use Authorization (EUA). This EUA will remain  in effect (meaning this test can be used) for the duration of the COVID-19 declaration under Section 564(b)(1) of the Act, 21 U.S.C.section 360bbb-3(b)(1), unless the authorization is terminated  or revoked sooner.       Influenza A by  PCR NEGATIVE NEGATIVE Final   Influenza B by PCR NEGATIVE NEGATIVE Final    Comment: (NOTE) The Xpert Xpress SARS-CoV-2/FLU/RSV plus assay is intended as an aid in the diagnosis of influenza from Nasopharyngeal swab specimens and should not be used as a sole basis for treatment. Nasal washings and aspirates are unacceptable for Xpert Xpress SARS-CoV-2/FLU/RSV testing.  Fact Sheet for Patients: BloggerCourse.com  Fact Sheet for Healthcare Providers: SeriousBroker.it  This test is not yet approved or cleared by the [place] FDA and has been authorized for detection and/or diagnosis of SARS-CoV-2 by FDA under an Emergency Use Authorization (EUA). This EUA will remain in effect (meaning this test can be used) for the duration of the COVID-19 declaration under Section 564(b)(1) of the Act, 21 U.S.C. section 360bbb-3(b)(1), unless the authorization is terminated or revoked.  Performed at [facility], [address] [place], [place] [address]      Scheduled Meds: . losartan  100 mg Oral Daily   Continuous Infusions: . 0.9 % NaCl with KCl 20 mEq / L 75 mL/hr at 04/18/20 1200  . cefTRIAXone (ROCEPHIN)  IV Stopped (04/17/20  2026)  . metronidazole 500 mg (04/18/20 1053)     LOS: 1 day   Lonia Blood, MD [facility] Office  [phone] Pager - Text Page per Amion  If 7PM-7AM, please contact night-coverage per Amion 04/18/2020, 5:18 PM

## 2020-04-19 ENCOUNTER — Encounter (HOSPITAL_COMMUNITY): Payer: Self-pay | Admitting: Oral Surgery

## 2020-04-19 DIAGNOSIS — K047 Periapical abscess without sinus: Secondary | ICD-10-CM | POA: Diagnosis not present

## 2020-04-19 DIAGNOSIS — I10 Essential (primary) hypertension: Secondary | ICD-10-CM | POA: Diagnosis not present

## 2020-04-19 DIAGNOSIS — E7801 Familial hypercholesterolemia: Secondary | ICD-10-CM | POA: Diagnosis not present

## 2020-04-19 DIAGNOSIS — L0201 Cutaneous abscess of face: Secondary | ICD-10-CM | POA: Diagnosis not present

## 2020-04-19 LAB — CBC
HCT: 39.4 % (ref 39.0–52.0)
Hemoglobin: 13.8 g/dL (ref 13.0–17.0)
MCH: 30.7 pg (ref 26.0–34.0)
MCHC: 35 g/dL (ref 30.0–36.0)
MCV: 87.8 fL (ref 80.0–100.0)
Platelets: 246 10*3/uL (ref 150–400)
RBC: 4.49 MIL/uL (ref 4.22–5.81)
RDW: 12 % (ref 11.5–15.5)
WBC: 17.1 10*3/uL — ABNORMAL HIGH (ref 4.0–10.5)
nRBC: 0 % (ref 0.0–0.2)

## 2020-04-19 LAB — BASIC METABOLIC PANEL
Anion gap: 8 (ref 5–15)
BUN: 8 mg/dL (ref 6–20)
CO2: 31 mmol/L (ref 22–32)
Calcium: 9 mg/dL (ref 8.9–10.3)
Chloride: 97 mmol/L — ABNORMAL LOW (ref 98–111)
Creatinine, Ser: 0.87 mg/dL (ref 0.61–1.24)
GFR, Estimated: >60 mL/min (ref >60)
Glucose, Bld: 134 mg/dL — ABNORMAL HIGH (ref 70–99)
Potassium: 4.4 mmol/L (ref 3.5–5.1)
Sodium: 136 mmol/L (ref 135–145)

## 2020-04-19 LAB — MAGNESIUM: Magnesium: 2.2 mg/dL (ref 1.7–2.4)

## 2020-04-19 NOTE — Progress Notes (Signed)
Progress Note - Infection  History of Present Illness: The patient developed a right submandibular/sublingual/masticator space infection about 1.5 weeks ago. He was transferred to [facility] and went to OR on 04/18/20 for extraoral I&D and removal of tooth #31.   Interval History 3/29 - Patient reports he is feeling much better than before surgery, able to open his mouth easier and swallow with less discomfort. WBC bump to 17 2/2 surgery and steroids. He has mentioned not having a BM in a few days.   Clinical Exam: Extraoral Exam:  Patient is alert, orientated and in no distress CN II-XII intact Right submandibular swelling present but improved compared to pre-op; drains in neck in place x 2 with SS/purulent output  Intraoral Exam:  The patient doeshave trismus with maximum incisal opening of 67mm. The vestibule is slightly raised The floor of mouth is slightly raised/edematous/erythematous on the right side The tongue is slightlyelevated.  Sutures in place, extract site closed.  Assessment/Plan:55 yoMpatient with a rightsubmandibular, sublingual, and masticatorspace infection associated with necrotic tooth #31 s/p extraoral I&D and removal of #31 on 04/18/20. Patient is doing well clinically and showing signs of improvement.   OMFS Recommendations -Patient is doing well, anticipate hopefully quick improvement in clinical course and potential d/c 3/30 or 3/31 -Soft mechanical diet today -Peridex (chlorhexidine) mouthrnise QID -Change Kerlix dressing as needed -ContinueCeftriaxone/Flagyl -Follow cultures -Daily CBC w/ diff -Stool softeners per hospitalist team  Herbie Saxon, DDS

## 2020-04-19 NOTE — Progress Notes (Signed)
Tim Welch  QAS:[id number] DOB: 04/25/1965 DOA: 04/17/2020 PCP: Marianne Sofia, PA-C    Brief Narrative:  [phone] with a hx of HLD and HTN who presented to the ED with severe right-sided facial swelling related to a dental infection which had been gradually worsening over a 2-week course.  He was previously treated with Zithromax then clindamycin w/o imrovement. In the ER CT of the neck revealed a right masticator and submandibular space inflammation with a new 4 x 4 x 3 cm collection involving the right pterygoid musculature wrapping around the inferior mandible with mass-effect on the oral cavity and pharynx.    Antimicrobials:  Rocephin 3/27 > Flagyl 3/27 >  DVT prophylaxis: SCDs  Consultants:  Oral Surgery  Code Status: FULL CODE  Subjective: Afebrile.  Vital signs stable.  Potassium and magnesium normal.  Resting comfortably in bed.  Significant improvement in ability to open mouth and talk.  States that he feels much better.  Denies shortness of breath or chest pain.  Assessment & Plan:  Right dental/facial/neck abscess with mass-effect on pharynx continue empiric abx coverage - care per Oral Surgery -appears to be improving nicely -possible discharge home 3/30  Hypokalemia Likely due to poor intake - corrected with supplementation  Hypomagnesemia Corrected with supplementation  Uncontrolled HTN Continue Cozaar - avoid diuretic use until clear patient can tolerate consistent oral intake -blood pressure reasonably controlled at present for the acute situation  HLD Hold Lipitor until patient able to eat consistently   Family Communication: Spoke with patient and family at bedside Status is: Inpatient  Remains inpatient appropriate because:Inpatient level of care appropriate due to severity of illness   Dispo: The patient is from: Home              Anticipated d/c is to: Home              Patient currently is not medically stable to d/c.   Difficult to place patient  No   Objective: Blood pressure (!) 144/82, pulse 68, temperature 98.6 F (37 C), temperature source Oral, resp. rate 17, height 6\' 2"  (1.88 m), weight 120.2 kg, SpO2 99 %.  Intake/Output Summary (Last 24 hours) at 04/19/2020 1042 Last data filed at 04/19/2020 0910 Gross per 24 hour  Intake 2052.7 ml  Output 20 ml  Net 2032.7 ml   Filed Weights   04/17/20 0934  Weight: 120.2 kg    Examination: General: No acute respiratory distress Lungs: Clear to auscultation bilaterally without wheezes or crackles Cardiovascular: Regular rate and rhythm without murmur gallop or rub normal S1 and S2 Abdomen: Nontender, nondistended, soft, bowel sounds positive, no rebound, no ascites, no appreciable mass Extremities: No significant cyanosis, clubbing, or edema bilateral lower extremities   CBC: Recent Labs  Lab 04/15/20 1808 04/17/20 1015 04/18/20 0042 04/19/20 0230  WBC 17.6* 13.5* 12.6* 17.1*  NEUTROABS 15.7* 9.9*  --   --   HGB 15.2 13.8 13.0 13.8  HCT 41.5 38.3* 37.0* 39.4  MCV 83.3 84.9 87.3 87.8  PLT 241 248 231 246   Basic Metabolic Panel: Recent Labs  Lab 04/17/20 1015 04/18/20 0042 04/19/20 0230  NA 137 137 136  K 2.9* 3.4* 4.4  CL 95* 99 97*  CO2 29 28 31   GLUCOSE 125* 131* 134*  BUN 20 8 8   CREATININE 1.11 0.96 0.87  CALCIUM 9.3 8.7* 9.0  MG  --  1.6* 2.2   GFR: Estimated Creatinine Clearance: 129.1 mL/min (by C-G formula based on SCr of  0.87 mg/dL).  Liver Function Tests: Recent Labs  Lab 04/18/20 0042  AST 22  ALT 44  ALKPHOS 49  BILITOT 0.7  PROT 5.9*  ALBUMIN 3.1*     Recent Results (from the past 240 hour(s))  Culture, blood (routine x 2)     Status: None (Preliminary result)   Collection Time: 04/15/20  6:00 PM   Specimen: BLOOD RIGHT FOREARM  Result Value Ref Range Status   Specimen Description   Final    BLOOD RIGHT FOREARM BLOOD Performed at [facility], [address] [place], [place] [address]    Special Requests    Final    BOTTLES DRAWN AEROBIC AND ANAEROBIC Blood Culture adequate volume Performed at [facility], [address] [place], [place] [address]    Culture   Final    NO GROWTH 3 DAYS Performed at [facility], [address] [place], [place] [address]    Report Status PENDING  Incomplete  Culture, blood (routine x 2)     Status: None (Preliminary result)   Collection Time: 04/15/20  6:15 PM   Specimen: Right Antecubital; Blood  Result Value Ref Range Status   Specimen Description   Final    RIGHT ANTECUBITAL BLOOD Performed at [facility], [address] [place], [place] [address]    Special Requests AEROBIC BOTTLE ONLY Blood Culture adequate volume  Final   Culture   Final    NO GROWTH 3 DAYS Performed at [facility], [address] [place], [place] [address]    Report Status PENDING  Incomplete  Culture, blood (single)     Status: None (Preliminary result)   Collection Time: 04/17/20 10:15 AM   Specimen: BLOOD  Result Value Ref Range Status   Specimen Description   Final    BLOOD RIGHT ANTECUBITAL Performed at [facility], [address] [place], [place] [address]    Special Requests   Final    BOTTLES DRAWN AEROBIC AND ANAEROBIC Blood Culture adequate volume Performed at [facility], [address] [place], [place] [address]    Culture   Final    NO GROWTH 2 DAYS Performed at [facility], [address] [place], [place] [address]    Report Status PENDING  Incomplete  Resp Panel by RT-PCR (Flu A&B, Covid) Nasopharyngeal Swab     Status: None   Collection Time: 04/17/20 10:15 AM   Specimen: Nasopharyngeal Swab; Nasopharyngeal(NP) swabs in vial transport medium  Result Value Ref Range Status   SARS Coronavirus 2 by RT PCR NEGATIVE NEGATIVE Final    Comment: (NOTE) SARS-CoV-2 target nucleic acids are NOT DETECTED.  The SARS-CoV-2 RNA is generally detectable in upper respiratory specimens during  the acute phase of infection. The lowest concentration of SARS-CoV-2 viral copies this assay can detect is 138 copies/mL. A negative result does not preclude SARS-Cov-2 infection and should not be used as the sole basis for treatment or other patient management decisions. A negative result may occur with  improper specimen collection/handling, submission of specimen other than nasopharyngeal swab, presence of viral mutation(s) within the areas targeted by this assay, and inadequate number of viral copies(<138 copies/mL). A negative result must be combined with clinical observations, patient history, and epidemiological information. The expected result is Negative.  Fact Sheet for Patients:  BloggerCourse.com  Fact Sheet for Healthcare Providers:  SeriousBroker.it  This test is no  t yet approved or cleared by the [place] and  has been authorized for detection and/or diagnosis of SARS-CoV-2 by FDA under an Emergency Use Authorization (EUA). This EUA will remain  in effect (meaning this test can be used) for the duration of the COVID-19 declaration under Section 564(b)(1) of the Act, 21 U.S.C.section 360bbb-3(b)(1), unless the authorization is terminated  or revoked sooner.       Influenza A by PCR NEGATIVE NEGATIVE Final   Influenza B by PCR NEGATIVE NEGATIVE Final    Comment: (NOTE) The Xpert Xpress SARS-CoV-2/FLU/RSV plus assay is intended as an aid in the diagnosis of influenza from Nasopharyngeal swab specimens and should not be used as a sole basis for treatment. Nasal washings and aspirates are unacceptable for Xpert Xpress SARS-CoV-2/FLU/RSV testing.  Fact Sheet for Patients: BloggerCourse.com  Fact Sheet for Healthcare Providers: SeriousBroker.it  This test is not yet approved or cleared by the [place] FDA and has been authorized for detection and/or  diagnosis of SARS-CoV-2 by FDA under an Emergency Use Authorization (EUA). This EUA will remain in effect (meaning this test can be used) for the duration of the COVID-19 declaration under Section 564(b)(1) of the Act, 21 U.S.C. section 360bbb-3(b)(1), unless the authorization is terminated or revoked.  Performed at [facility], [address] [place], [place] [address]   Aerobic/Anaerobic Culture w Gram Stain (surgical/deep wound)     Status: None (Preliminary result)   Collection Time: 04/18/20  3:33 PM   Specimen: Abscess  Result Value Ref Range Status   Specimen Description ABSCESS RIGHT NECK  Final   Special Requests NONE  Final   Gram Stain   Final    ABUNDANT WBC PRESENT, PREDOMINANTLY PMN ABUNDANT GRAM POSITIVE COCCI ABUNDANT GRAM NEGATIVE RODS MODERATE GRAM POSITIVE RODS Performed at [facility], [address], [place], [place] [address]    Culture PENDING  Incomplete   Report Status PENDING  Incomplete     Scheduled Meds: . losartan  100 mg Oral Daily   Continuous Infusions: . 0.9 % NaCl with KCl 20 mEq / L 75 mL/hr at 04/18/20 1200  . cefTRIAXone (ROCEPHIN)  IV 2 g (04/18/20 2002)  . metronidazole 500 mg (04/19/20 2542)     LOS: 2 days   Lonia Blood, MD [facility] Office  [phone] Pager - Text Page per Amion  If 7PM-7AM, please contact night-coverage per Amion 04/19/2020, 10:42 AM

## 2020-04-20 ENCOUNTER — Ambulatory Visit: Payer: No Typology Code available for payment source | Admitting: Medical

## 2020-04-20 DIAGNOSIS — K0889 Other specified disorders of teeth and supporting structures: Secondary | ICD-10-CM

## 2020-04-20 DIAGNOSIS — E7801 Familial hypercholesterolemia: Secondary | ICD-10-CM

## 2020-04-20 DIAGNOSIS — K047 Periapical abscess without sinus: Principal | ICD-10-CM

## 2020-04-20 DIAGNOSIS — I10 Essential (primary) hypertension: Secondary | ICD-10-CM | POA: Diagnosis not present

## 2020-04-20 MED ORDER — KETOROLAC TROMETHAMINE 30 MG/ML IJ SOLN
30.0000 mg | Freq: Four times a day (QID) | INTRAMUSCULAR | Status: DC
  Administered 2020-04-20 – 2020-04-21 (×4): 30 mg via INTRAVENOUS
  Filled 2020-04-20 (×4): qty 1

## 2020-04-20 MED ORDER — PANTOPRAZOLE SODIUM 40 MG PO TBEC
40.0000 mg | DELAYED_RELEASE_TABLET | Freq: Every day | ORAL | Status: DC
Start: 2020-04-20 — End: 2020-04-21
  Administered 2020-04-20 – 2020-04-21 (×2): 40 mg via ORAL
  Filled 2020-04-20 (×2): qty 1

## 2020-04-20 MED ORDER — HYDROCORTISONE (PERIANAL) 2.5 % EX CREA
TOPICAL_CREAM | Freq: Three times a day (TID) | CUTANEOUS | Status: DC
  Filled 2020-04-20: qty 28.35

## 2020-04-20 NOTE — Progress Notes (Signed)
Tim Welch  WVP:[id number] DOB: Nov 16, 1965 DOA: 04/17/2020 PCP: Marianne Sofia, PA-C    Brief Narrative:  [phone] with a hx of HLD and HTN who presented to the ED with severe right-sided facial swelling related to a dental infection which had been gradually worsening over a 2-week course.  He was previously treated with Zithromax then clindamycin w/o imrovement. In the ER CT of the neck revealed a right masticator and submandibular space inflammation with a new 4 x 4 x 3 cm collection involving the right pterygoid musculature wrapping around the inferior mandible with mass-effect on the oral cavity and pharynx.    Antimicrobials:  Rocephin 3/27 > Flagyl 3/27 >  DVT prophylaxis: SCDs  Consultants:  Oral Surgery  Code Status: FULL CODE  Subjective: Feels as though there is more swelling and soreness in his right face. Attributes it to increasing movement in his jaw yesterday. Overall he is feeling better.  Assessment & Plan:  Right dental/facial/neck abscess with mass-effect on pharynx continue empiric abx coverage - care per Oral Surgery -appears to be improving nicely -possible discharge home 3/31 -will plan on transitioning ceftriaxone to oral keflex -follow up cultures  Hypokalemia Likely due to poor intake - corrected with supplementation  Hypomagnesemia Corrected with supplementation  Uncontrolled HTN Continue Cozaar - avoid diuretic use until clear patient can tolerate consistent oral intake -blood pressure reasonably controlled at present for the acute situation  HLD Hold Lipitor until patient able to eat consistently   Family Communication: Spoke with patient at bedside Status is: Inpatient  Remains inpatient appropriate because:Inpatient level of care appropriate due to severity of illness   Dispo: The patient is from: Home              Anticipated d/c is to: Home              Patient currently is not medically stable to d/c.   Difficult to place patient  No   Objective: Blood pressure (!) 141/75, pulse 65, temperature 98 F (36.7 C), temperature source Oral, resp. rate 16, height 6\' 2"  (1.88 m), weight 120.2 kg, SpO2 98 %. No intake or output data in the 24 hours ending 04/20/20 1116 Filed Weights   04/17/20 0934  Weight: 120.2 kg    Examination: General exam: Alert, awake, oriented x 3 HEENT: dressing over right mandibular area Respiratory system: Clear to auscultation. Respiratory effort normal. Cardiovascular system:RRR. No murmurs, rubs, gallops. Gastrointestinal system: Abdomen is nondistended, soft and nontender. No organomegaly or masses felt. Normal bowel sounds heard. Central nervous system: Alert and oriented. No focal neurological deficits. Extremities: No C/C/E, +pedal pulses Skin: No rashes, lesions or ulcers Psychiatry: Judgement and insight appear normal. Mood & affect appropriate.     CBC: Recent Labs  Lab 04/15/20 1808 04/17/20 1015 04/18/20 0042 04/19/20 0230  WBC 17.6* 13.5* 12.6* 17.1*  NEUTROABS 15.7* 9.9*  --   --   HGB 15.2 13.8 13.0 13.8  HCT 41.5 38.3* 37.0* 39.4  MCV 83.3 84.9 87.3 87.8  PLT 241 248 231 246   Basic Metabolic Panel: Recent Labs  Lab 04/17/20 1015 04/18/20 0042 04/19/20 0230  NA 137 137 136  K 2.9* 3.4* 4.4  CL 95* 99 97*  CO2 29 28 31   GLUCOSE 125* 131* 134*  BUN 20 8 8   CREATININE 1.11 0.96 0.87  CALCIUM 9.3 8.7* 9.0  MG  --  1.6* 2.2   GFR: Estimated Creatinine Clearance: 129.1 mL/min (by C-G formula based on SCr of  0.87 mg/dL).  Liver Function Tests: Recent Labs  Lab 04/18/20 0042  AST 22  ALT 44  ALKPHOS 49  BILITOT 0.7  PROT 5.9*  ALBUMIN 3.1*     Recent Results (from the past 240 hour(s))  Culture, blood (routine x 2)     Status: None (Preliminary result)   Collection Time: 04/15/20  6:00 PM   Specimen: BLOOD RIGHT FOREARM  Result Value Ref Range Status   Specimen Description   Final    BLOOD RIGHT FOREARM BLOOD Performed at [facility], [address] [place], [place] [address]    Special Requests   Final    BOTTLES DRAWN AEROBIC AND ANAEROBIC Blood Culture adequate volume Performed at [facility], [address], [place], [place] [address]    Culture   Final    NO GROWTH 4 DAYS Performed at [facility], [address], [place], [place] [address]    Report Status PENDING  Incomplete  Culture, blood (routine x 2)     Status: None (Preliminary result)   Collection Time: 04/15/20  6:15 PM   Specimen: Right Antecubital; Blood  Result Value Ref Range Status   Specimen Description   Final    RIGHT ANTECUBITAL BLOOD Performed at [facility], [address] [place], [place] [address]    Special Requests AEROBIC BOTTLE ONLY Blood Culture adequate volume  Final   Culture   Final    NO GROWTH 4 DAYS Performed at [facility], [address] [place], [place] [address]    Report Status PENDING  Incomplete  Culture, blood (single)     Status: None (Preliminary result)   Collection Time: 04/17/20 10:15 AM   Specimen: BLOOD  Result Value Ref Range Status   Specimen Description   Final    BLOOD RIGHT ANTECUBITAL Performed at [facility], [address] [place], [place] [address]    Special Requests   Final    BOTTLES DRAWN AEROBIC AND ANAEROBIC Blood Culture adequate volume Performed at [facility], [address] [place], [place] [address]    Culture   Final    NO GROWTH 3 DAYS Performed at [facility], [address], [place], [place] [address]    Report Status PENDING  Incomplete  Resp Panel by RT-PCR (Flu A&B, Covid) Nasopharyngeal Swab     Status: None   Collection Time: 04/17/20 10:15 AM   Specimen: Nasopharyngeal Swab; Nasopharyngeal(NP) swabs in vial transport medium  Result Value Ref Range Status   SARS Coronavirus 2 by RT PCR NEGATIVE NEGATIVE Final    Comment: (NOTE) SARS-CoV-2 target nucleic acids are NOT DETECTED.  The  SARS-CoV-2 RNA is generally detectable in upper respiratory specimens during the acute phase of infection. The lowest concentration of SARS-CoV-2 viral copies this assay can detect is 138 copies/mL. A negative result does not preclude SARS-Cov-2 infection and should not be used as the sole basis for treatment or other patient management decisions. A negative result may occur with  improper specimen collection/handling, submission of specimen other than nasopharyngeal swab, presence of viral mutation(s) within the areas targeted by this assay, and inadequate number of viral copies(<138 copies/mL). A negative result must be combined with clinical observations, patient history, and epidemiological information. The expected result is Negative.  Fact Sheet for Patients:  BloggerCourse.com  Fact Sheet for Healthcare Providers:  SeriousBroker.it  This test is no  t yet approved or cleared by the [place] and  has been authorized for detection and/or diagnosis of SARS-CoV-2 by FDA under an Emergency Use Authorization (EUA). This EUA will remain  in effect (meaning this test can be used) for the duration of the COVID-19 declaration under Section 564(b)(1) of the Act, 21 U.S.C.section 360bbb-3(b)(1), unless the authorization is terminated  or revoked sooner.       Influenza A by PCR NEGATIVE NEGATIVE Final   Influenza B by PCR NEGATIVE NEGATIVE Final    Comment: (NOTE) The Xpert Xpress SARS-CoV-2/FLU/RSV plus assay is intended as an aid in the diagnosis of influenza from Nasopharyngeal swab specimens and should not be used as a sole basis for treatment. Nasal washings and aspirates are unacceptable for Xpert Xpress SARS-CoV-2/FLU/RSV testing.  Fact Sheet for Patients: BloggerCourse.com  Fact Sheet for Healthcare Providers: SeriousBroker.it  This test is not yet approved or  cleared by the [place] FDA and has been authorized for detection and/or diagnosis of SARS-CoV-2 by FDA under an Emergency Use Authorization (EUA). This EUA will remain in effect (meaning this test can be used) for the duration of the COVID-19 declaration under Section 564(b)(1) of the Act, 21 U.S.C. section 360bbb-3(b)(1), unless the authorization is terminated or revoked.  Performed at [facility], [address] [place], [place] [address]   Aerobic/Anaerobic Culture w Gram Stain (surgical/deep wound)     Status: None (Preliminary result)   Collection Time: 04/18/20  3:33 PM   Specimen: Abscess  Result Value Ref Range Status   Specimen Description ABSCESS RIGHT NECK  Final   Special Requests NONE  Final   Gram Stain   Final    ABUNDANT WBC PRESENT, PREDOMINANTLY PMN ABUNDANT GRAM POSITIVE COCCI ABUNDANT GRAM NEGATIVE RODS MODERATE GRAM POSITIVE RODS    Culture   Final    CULTURE REINCUBATED FOR BETTER GROWTH Performed at [facility], [address], [place], [place] [address]    Report Status PENDING  Incomplete     Scheduled Meds: . hydrocortisone   Rectal TID  . ketorolac  30 mg Intravenous Q6H  . losartan  100 mg Oral Daily  . pantoprazole  40 mg Oral Daily   Continuous Infusions: . cefTRIAXone (ROCEPHIN)  IV 2 g (04/19/20 2116)  . metronidazole 500 mg (04/20/20 1057)     LOS: 3 days   Erick Blinks, MD [facility] Office  [phone] Pager - Text Page per Amion  If 7PM-7AM, please contact night-coverage per Amion 04/20/2020, 11:16 AM

## 2020-04-20 NOTE — Progress Notes (Signed)
Progress Note - Infection  History of Present Illness: The patient developed a right submandibular/sublingual/masticator space infection about 1.5 weeks ago. He was transferred to [facility] and went to OR on 04/18/20 for extraoral I&D and removal of tooth #31.   Interval History 3/29 - Patient reports he is feeling much better than before surgery, able to open his mouth easier and swallow with less discomfort. WBC bump to 17 2/2 surgery and steroids. He has mentioned not having a BM in a few days. 3/30 - Patient reports continued improvement and has been working on jaw ROM exercises. Some jaw soreness. Continued drain output. Labs not drawn yet. Eating without difficulty.   Clinical Exam: Extraoral Exam:  Patient is alert, orientated and in no distress CN II-XII intact Right submandibular swelling present but improved compared to pre-op; drains in neck in place x 2 with SS/purulent output still  Intraoral Exam:  The patient doeshave trismus with maximum incisal opening of 30mm. The vestibule is slightly raised The floor of mouth is slightly raised/edematous/erythematous on the right side The tongue is slightlyelevated.  Sutures in place, extract site closed.  Assessment/Plan:55 yoMpatient with a rightsubmandibular, sublingual, and masticatorspace infection associated with necrotic tooth #31 s/p extraoral I&D and removal of #31 on 04/18/20. Patient is doing well clinically and showing signs of improvement.   OMFS Recommendations -Patient is doing well, anticipate d/c 3/31; keep inpatient today for additional IV abx; will need to find oral conversion for Rocephin for d/c - Continue Soft mechanical diet  -Peridex (chlorhexidine) mouthrnise QID -Change Kerlix dressing as needed -ContinueCeftriaxone/Flagyl -Follow cultures -Daily CBC w/ diff -Stool softeners per hospitalist team  Herbie Saxon,  DDS

## 2020-04-20 NOTE — Plan of Care (Signed)

## 2020-04-21 DIAGNOSIS — K047 Periapical abscess without sinus: Secondary | ICD-10-CM | POA: Diagnosis not present

## 2020-04-21 DIAGNOSIS — I10 Essential (primary) hypertension: Secondary | ICD-10-CM | POA: Diagnosis not present

## 2020-04-21 DIAGNOSIS — E7801 Familial hypercholesterolemia: Secondary | ICD-10-CM | POA: Diagnosis not present

## 2020-04-21 DIAGNOSIS — L0201 Cutaneous abscess of face: Secondary | ICD-10-CM

## 2020-04-21 LAB — CBC WITH DIFFERENTIAL/PLATELET
Abs Immature Granulocytes: 0.11 10*3/uL — ABNORMAL HIGH (ref 0.00–0.07)
Basophils Absolute: 0 10*3/uL (ref 0.0–0.1)
Basophils Relative: 0 %
Eosinophils Absolute: 0.1 10*3/uL (ref 0.0–0.5)
Eosinophils Relative: 1 %
HCT: 35.9 % — ABNORMAL LOW (ref 39.0–52.0)
Hemoglobin: 12.4 g/dL — ABNORMAL LOW (ref 13.0–17.0)
Immature Granulocytes: 1 %
Lymphocytes Relative: 31 %
Lymphs Abs: 2.8 10*3/uL (ref 0.7–4.0)
MCH: 30.9 pg (ref 26.0–34.0)
MCHC: 34.5 g/dL (ref 30.0–36.0)
MCV: 89.5 fL (ref 80.0–100.0)
Monocytes Absolute: 1.1 10*3/uL — ABNORMAL HIGH (ref 0.1–1.0)
Monocytes Relative: 12 %
Neutro Abs: 5.1 10*3/uL (ref 1.7–7.7)
Neutrophils Relative %: 55 %
Platelets: 265 10*3/uL (ref 150–400)
RBC: 4.01 MIL/uL — ABNORMAL LOW (ref 4.22–5.81)
RDW: 12 % (ref 11.5–15.5)
WBC: 9.1 10*3/uL (ref 4.0–10.5)
nRBC: 0 % (ref 0.0–0.2)

## 2020-04-21 LAB — CULTURE, BLOOD (ROUTINE X 2)
Culture: NO GROWTH
Culture: NO GROWTH
Special Requests: ADEQUATE
Special Requests: ADEQUATE

## 2020-04-21 MED ORDER — METRONIDAZOLE 500 MG PO TABS: 500.0000 mg | ORAL_TABLET | Freq: Three times a day (TID) | ORAL | 0 refills | Status: AC

## 2020-04-21 MED ORDER — CEPHALEXIN 500 MG PO CAPS: 500.0000 mg | ORAL_CAPSULE | Freq: Four times a day (QID) | ORAL | 0 refills | Status: AC

## 2020-04-21 MED ORDER — PANTOPRAZOLE SODIUM 40 MG PO TBEC: 40.0000 mg | DELAYED_RELEASE_TABLET | Freq: Every day | ORAL | 0 refills | Status: DC

## 2020-04-21 NOTE — Progress Notes (Signed)
Gave pt discharge instruction. IV d/c. Gave pt dressing for wound until Monday. Pt has all belonging and waiting for his ride.

## 2020-04-21 NOTE — Discharge Summary (Signed)
Physician Discharge Summary  Tim Welch VZD:[id number] DOB: 08/28/1965 DOA: 04/17/2020  PCP: Marianne Sofia, PA-C  Admit date: 04/17/2020 Discharge date: 04/21/2020  Admitted From: home Disposition:  home  Recommendations for Outpatient Follow-up:  1. Follow up with PCP in 1-2 weeks 2. Please obtain BMP/CBC in one week 3. Follow up with Dr. Ross Marcus tomorrow to consider drain removal  Discharge Condition:stable CODE STATUS:full code Diet recommendation: soft diet  Brief/Interim Summary: 55yowith a hx of HLD and HTN who presented to the ED with severe right-sided facial swellingrelated to a dental infectionwhich had been gradually worsening over a 2-week course. He was previously treated with Zithromax then clindamycin w/o imrovement. In the ERCT of the neck revealed a right masticator and submandibular space inflammation with a new 4 x 4 x 3 cm collection involving the right pterygoid musculature wrapping around the inferior mandible with mass-effect on the oral cavity and pharynx.  Discharge Diagnoses:  Active Problems:   Essential (primary) hypertension   Familial hypercholesterolemia   Dental abscess   Facial abscess  Right dental/facial/neck abscess with mass-effect on pharynx -s/p I and D of sublingual/submandibular/masticator space abscess with placement of penrose drain -he was treated with empiric abx coverage with ceftriaxone and flagyl - care per Oral Surgery -appears to be improving nicely  -will plan on transitioning ceftriaxone to oral keflex -continue antibiotics for another 7 days -follow up with oral surgery tomorrow for possible drain removal -follow up cultures  Hypokalemia Likely due to poor intake-corrected with supplementation  Hypomagnesemia Corrected with supplementation  Uncontrolled HTN BP stable, resume cozaar and HCTZ on discharge  HLD Resume lipitor on discharge  Discharge Instructions  Discharge Instructions    Diet - low sodium  heart healthy   Complete by: As directed    Increase activity slowly   Complete by: As directed      Allergies as of 04/21/2020      Reactions   Penicillins Hives   Did it involve swelling of the face/tongue/throat, SOB, or low BP? N Did it involve sudden or severe rash/hives, skin peeling, or any reaction on the inside of your mouth or nose? Y Did you need to seek medical attention at a hospital or doctor's office? N When did it last happen?Several Years Ago If all above answers are "NO", may proceed with cephalosporin use.      Medication List    STOP taking these medications   clindamycin 150 MG capsule Commonly known as: CLEOCIN     TAKE these medications   atorvastatin 10 MG tablet Commonly known as: LIPITOR TAKE 1 TABLET BY MOUTH EVERY DAY FOR CHOLESTEROL   cephALEXin 500 MG capsule Commonly known as: KEFLEX Take 1 capsule (500 mg total) by mouth 4 (four) times daily for 7 days.   hydrochlorothiazide 25 MG tablet Commonly known as: HYDRODIURIL TAKE 1 TABLET BY MOUTH EVERY DAY   levocetirizine 5 MG tablet Commonly known as: XYZAL Take 1 tablet (5 mg total) by mouth every evening.   losartan 100 MG tablet Commonly known as: COZAAR Take 1 tablet (100 mg total) by mouth daily. What changed: how much to take   metroNIDAZOLE 500 MG tablet Commonly known as: Flagyl Take 1 tablet (500 mg total) by mouth 3 (three) times daily for 7 days.   pantoprazole 40 MG tablet Commonly known as: PROTONIX Take 1 tablet (40 mg total) by mouth daily. Start taking on: April 22, 2020   potassium chloride 10 MEQ tablet Commonly known as: KLOR-CON Take 1  tablet (10 mEq total) by mouth daily.   sildenafil 20 MG tablet Commonly known as: REVATIO 1 tab as needed prior to sex       Follow-up Information    Enis Slipper, DMD Follow up.   Specialty: Oral Surgery Why: call for appointment Contact information: [address] [place]  [address] [phone]              Allergies  Allergen Reactions  . Penicillins Hives    Did it involve swelling of the face/tongue/throat, SOB, or low BP? N Did it involve sudden or severe rash/hives, skin peeling, or any reaction on the inside of your mouth or nose? Y Did you need to seek medical attention at a hospital or doctor's office? N When did it last happen?Several Years Ago If all above answers are "NO", may proceed with cephalosporin use.     Consultations:  Dr. Ross Marcus, Oral surgery   Procedures/Studies: DG Orthopantogram  Result Date: 04/17/2020 CLINICAL DATA:  Right-side facial, jaw and neck pain and swelling. EXAM: ORTHOPANTOGRAM/PANORAMIC COMPARISON:  None. FINDINGS: Tiny periapical lucency around the right lower first molar. Equipped occult for abscess. No fractures or dislocation. Imaged portions of the paranasal sinuses are clear. IMPRESSION: Tiny periapical lucency around the right lower first molar is equivocal for periapical abscess. Electronically Signed   By: Signa Kell M.D.   On: 04/17/2020 18:06   CT Soft Tissue Neck W Contrast  Result Date: 04/17/2020 CLINICAL DATA:  Facial swelling.  Tooth pain EXAM: CT NECK WITH CONTRAST TECHNIQUE: Multidetector CT imaging of the neck was performed using the standard protocol following the bolus administration of intravenous contrast. CONTRAST:  23mL OMNIPAQUE IOHEXOL 300 MG/ML  SOLN COMPARISON:  Two days ago FINDINGS: Pharynx and larynx: Mass effect on the upper pharynx due to masticator space inflammation. No pharyngeal or laryngeal based mucosal inflammatory or masslike finding. Salivary glands: Edematous appearance around the mildly enlarged right submandibular gland which appears secondary. Thyroid: Negative Lymph nodes: Adenitis in the upper and lateral right neck. No nodal cavitation. Vascular: No major vessel occlusion/thrombosis. Limited intracranial: Negative Visualized orbits: Negative Mastoids and  visualized paranasal sinuses: Clear Skeleton: In the setting of dental pain there is a presumably odontogenic inflammatory process involving the right masticator and submandibular spaces with interval evolution to a rim enhancing collection expanding right pterygoid musculature and wrapping around the inferior mandible at the body and angle. The low-density area measures up to 4 x 3 x 4 cm and causes mass effect on the oral cavity and upper pharynx. IMPRESSION: Evolving right masticator and submandibular space inflammation, odontogenic by history. There is now a 4 x 4 x 3 cm collection involving the right pterygoid musculature and wrapping around the inferior mandible, with mass effect on the oral cavity and pharynx. Electronically Signed   By: Marnee Spring M.D.   On: 04/17/2020 11:38   CT Soft Tissue Neck W Contrast  Result Date: 04/15/2020 CLINICAL DATA:  Right lower molar dental abscess EXAM: CT NECK WITH CONTRAST TECHNIQUE: Multidetector CT imaging of the neck was performed using the standard protocol following the bolus administration of intravenous contrast. CONTRAST:  71mL OMNIPAQUE IOHEXOL 300 MG/ML  SOLN COMPARISON:  None. FINDINGS: Pharynx and larynx: Unremarkable.  No mass or swelling. Salivary glands: Partial fatty replacement of the parotids. Left submandibular gland is unremarkable. Right submandibular gland is surrounded by inflammatory changes and appears relatively enlarged. Thyroid: Normal. Lymph nodes: Asymmetric likely reactive nonenlarged right cervical lymph nodes in  the suprahyoid neck. Vascular: Major neck vessels are patent. Trace calcified plaque at the common carotid bifurcations. Limited intracranial: No abnormal enhancement. Visualized orbits: Unremarkable. Mastoids and visualized paranasal sinuses: Minor mucosal thickening. Mastoid air cells are clear. Skeleton: Mild cervical spine degenerative changes. Upper chest: Included upper lungs are clear. Other: Infiltration of the fat  lower right face and submandibular space extending along the lingual surface of the mandible. No evidence of abscess. IMPRESSION: Right lower facial cellulitis with involvement of the submandibular space and lingual surface of the mandible. No soft tissue abscess. The submandibular gland is primarily or secondarily involved. There is no periapical lucency. Electronically Signed   By: Guadlupe Spanish M.D.   On: 04/15/2020 20:27       Subjective: No pain, feels that swelling may be a little better  Discharge Exam: Vitals:   04/20/20 0341 04/20/20 1457 04/20/20 2004 04/21/20 0419  BP: (!) 141/75 (!) 149/80 (!) 153/78 131/77  Pulse: 65 72 72 61  Resp: Temp: 98 F (36.7 C) 98 F (36.7 C) 98.1 F (36.7 C) 98.4 F (36.9 C)  TempSrc: Oral Oral Oral Oral  SpO2: 98% 98% 97% 95%  Weight:      Height:        General: Pt is alert, awake, not in acute distress Cardiovascular: RRR, S1/S2 +, no rubs, no gallops Respiratory: CTA bilaterally, no wheezing, no rhonchi Abdominal: Soft, NT, ND, bowel sounds + Extremities: no edema, no cyanosis    The results of significant diagnostics from this hospitalization (including imaging, microbiology, ancillary and laboratory) are listed below for reference.     Microbiology: Recent Results (from the past 240 hour(s))  Culture, blood (routine x 2)     Status: None   Collection Time: 04/15/20  6:00 PM   Specimen: BLOOD RIGHT FOREARM  Result Value Ref Range Status   Specimen Description   Final    BLOOD RIGHT FOREARM BLOOD Performed at [facility], [address] [place], [place] [address]    Special Requests   Final    BOTTLES DRAWN AEROBIC AND ANAEROBIC Blood Culture adequate volume Performed at [facility], [address] [place], [place] [address]    Culture   Final    NO GROWTH 5 DAYS Performed at [facility], [address], [place], [place] [address]    Report Status 04/21/2020 FINAL  Final   Culture, blood (routine x 2)     Status: None   Collection Time: 04/15/20  6:15 PM   Specimen: Right Antecubital; Blood  Result Value Ref Range Status   Specimen Description   Final    RIGHT ANTECUBITAL BLOOD Performed at [facility], [address] [place], [place] [address]    Special Requests AEROBIC BOTTLE ONLY Blood Culture adequate volume  Final   Culture   Final    NO GROWTH 5 DAYS Performed at [facility], [address] [place], [place] [address]    Report Status 04/21/2020 FINAL  Final  Culture, blood (single)     Status: None (Preliminary result)   Collection Time: 04/17/20 10:15 AM   Specimen: BLOOD  Result Value Ref Range Status   Specimen Description   Final    BLOOD RIGHT ANTECUBITAL Performed at [facility], [address], [place], [place] [address]    Special Requests   Final    BOTTLES DRAWN AEROBIC AND  ANAEROBIC Blood Culture adequate volume Performed at [facility], [address] [place], [place] [address]    Culture   Final    NO GROWTH 4 DAYS Performed at [facility], [address] [place], [place] [address]    Report Status PENDING  Incomplete  Resp Panel by RT-PCR (Flu A&B, Covid) Nasopharyngeal Swab     Status: None   Collection Time: 04/17/20 10:15 AM   Specimen: Nasopharyngeal Swab; Nasopharyngeal(NP) swabs in vial transport medium  Result Value Ref Range Status   SARS Coronavirus 2 by RT PCR NEGATIVE NEGATIVE Final    Comment: (NOTE) SARS-CoV-2 target nucleic acids are NOT DETECTED.  The SARS-CoV-2 RNA is generally detectable in upper respiratory specimens during the acute phase of infection. The lowest concentration of SARS-CoV-2 viral copies this assay can detect is 138 copies/mL. A negative result does not preclude SARS-Cov-2 infection and should not be used as the sole basis for treatment or other patient management decisions. A negative result may occur with  improper specimen  collection/handling, submission of specimen other than nasopharyngeal swab, presence of viral mutation(s) within the areas targeted by this assay, and inadequate number of viral copies(<138 copies/mL). A negative result must be combined with clinical observations, patient history, and epidemiological information. The expected result is Negative.  Fact Sheet for Patients:  BloggerCourse.com  Fact Sheet for Healthcare Providers:  SeriousBroker.it  This test is no t yet approved or cleared by the [place] FDA and  has been authorized for detection and/or diagnosis of SARS-CoV-2 by FDA under an Emergency Use Authorization (EUA). This EUA will remain  in effect (meaning this test can be used) for the duration of the COVID-19 declaration under Section 564(b)(1) of the Act, 21 U.S.C.section 360bbb-3(b)(1), unless the authorization is terminated  or revoked sooner.       Influenza A by PCR NEGATIVE NEGATIVE Final   Influenza B by PCR NEGATIVE NEGATIVE Final    Comment: (NOTE) The Xpert Xpress SARS-CoV-2/FLU/RSV plus assay is intended as an aid in the diagnosis of influenza from Nasopharyngeal swab specimens and should not be used as a sole basis for treatment. Nasal washings and aspirates are unacceptable for Xpert Xpress SARS-CoV-2/FLU/RSV testing.  Fact Sheet for Patients: BloggerCourse.com  Fact Sheet for Healthcare Providers: SeriousBroker.it  This test is not yet approved or cleared by the [place] FDA and has been authorized for detection and/or diagnosis of SARS-CoV-2 by FDA under an Emergency Use Authorization (EUA). This EUA will remain in effect (meaning this test can be used) for the duration of the COVID-19 declaration under Section 564(b)(1) of the Act, 21 U.S.C. section 360bbb-3(b)(1), unless the authorization is terminated or revoked.  Performed at [facility], [address] [place], [place] [address]   Aerobic/Anaerobic Culture w Gram Stain (surgical/deep wound)     Status: None (Preliminary result)   Collection Time: 04/18/20  3:33 PM   Specimen: Abscess  Result Value Ref Range Status   Specimen Description ABSCESS RIGHT NECK  Final   Special Requests NONE  Final   Gram Stain   Final    ABUNDANT WBC PRESENT, PREDOMINANTLY PMN ABUNDANT GRAM POSITIVE COCCI ABUNDANT GRAM NEGATIVE RODS MODERATE GRAM POSITIVE RODS    Culture   Final    HOLDING FOR POSSIBLE ANAEROBE Performed at [facility], [address], [place], [place] [address]    Report Status PENDING  Incomplete     Labs: BNP (last  3 results) No results for input(s): BNP in the last 8760 hours. Basic Metabolic Panel: Recent Labs  Lab 04/15/20 1808 04/17/20 1015 04/18/20 0042 04/19/20 0230  NA 132* 137 137 136  K 2.5* 2.9* 3.4* 4.4  CL 87* 95* 99 97*  CO2 29 29 28 31   GLUCOSE 146* 125* 131* 134*  BUN 14 20 8 8   CREATININE 1.57* 1.11 0.96 0.87  CALCIUM 9.5 9.3 8.7* 9.0  MG  --   --  1.6* 2.2   Liver Function Tests: Recent Labs  Lab 04/18/20 0042  AST 22  ALT 44  ALKPHOS 49  BILITOT 0.7  PROT 5.9*  ALBUMIN 3.1*   No results for input(s): LIPASE, AMYLASE in the last 168 hours. No results for input(s): AMMONIA in the last 168 hours. CBC: Recent Labs  Lab 04/15/20 1808 04/17/20 1015 04/18/20 0042 04/19/20 0230 04/21/20 0126  WBC 17.6* 13.5* 12.6* 17.1* 9.1  NEUTROABS 15.7* 9.9*  --   --  5.1  HGB 15.2 13.8 13.0 13.8 12.4*  HCT 41.5 38.3* 37.0* 39.4 35.9*  MCV 83.3 84.9 87.3 87.8 89.5  PLT 241 248 231 246 265   Cardiac Enzymes: No results for input(s): CKTOTAL, CKMB, CKMBINDEX, TROPONINI in the last 168 hours. BNP: Invalid input(s): POCBNP CBG: No results for input(s): GLUCAP in the last 168 hours. D-Dimer No results for input(s): DDIMER in the last 72 hours. Hgb A1c No results for input(s): HGBA1C in the last 72  hours. Lipid Profile No results for input(s): CHOL, HDL, LDLCALC, TRIG, CHOLHDL, LDLDIRECT in the last 72 hours. Thyroid function studies No results for input(s): TSH, T4TOTAL, T3FREE, THYROIDAB in the last 72 hours.  Invalid input(s): FREET3 Anemia work up No results for input(s): VITAMINB12, FOLATE, FERRITIN, TIBC, IRON, RETICCTPCT in the last 72 hours. Urinalysis    Component Value Date/Time   COLORURINE YELLOW 06/25/2017 0929   APPEARANCEUR Cloudy (A) 06/25/2017 0929   LABSPEC 1.025 06/25/2017 0929   PHURINE 5.0 06/25/2017 0929   GLUCOSEU NEGATIVE 06/25/2017 0929   HGBUR SMALL (A) 06/25/2017 0929   BILIRUBINUR NEGATIVE 06/25/2017 0929   KETONESUR NEGATIVE 06/25/2017 0929   UROBILINOGEN 0.2 06/25/2017 0929   NITRITE NEGATIVE 06/25/2017 0929   LEUKOCYTESUR NEGATIVE 06/25/2017 0929   Sepsis Labs Invalid input(s): PROCALCITONIN,  WBC,  LACTICIDVEN Microbiology Recent Results (from the past 240 hour(s))  Culture, blood (routine x 2)     Status: None   Collection Time: 04/15/20  6:00 PM   Specimen: BLOOD RIGHT FOREARM  Result Value Ref Range Status   Specimen Description   Final    BLOOD RIGHT FOREARM BLOOD Performed at [facility] [place], [address] [place], [place] [address]    Special Requests   Final    BOTTLES DRAWN AEROBIC AND ANAEROBIC Blood Culture adequate volume Performed at [facility] [place], [address] [place], [place] [address]    Culture   Final    NO GROWTH 5 DAYS Performed at [facility], [address] [place], [place] [address]    Report Status 04/21/2020 FINAL  Final  Culture, blood (routine x 2)     Status: None   Collection Time: 04/15/20  6:15 PM   Specimen: Right Antecubital; Blood  Result Value Ref Range Status   Specimen Description   Final    RIGHT ANTECUBITAL BLOOD Performed at [facility] [place], [address] [place], [place] [address]    Special Requests AEROBIC BOTTLE ONLY  Blood Culture adequate  volume  Final   Culture   Final    NO GROWTH 5 DAYS Performed at [facility], [address], [place], [place] [address]    Report Status 04/21/2020 FINAL  Final  Culture, blood (single)     Status: None (Preliminary result)   Collection Time: 04/17/20 10:15 AM   Specimen: BLOOD  Result Value Ref Range Status   Specimen Description   Final    BLOOD RIGHT ANTECUBITAL Performed at [facility], [address] [place], [place] [address]    Special Requests   Final    BOTTLES DRAWN AEROBIC AND ANAEROBIC Blood Culture adequate volume Performed at [facility], [address] [place], [place] [address]    Culture   Final    NO GROWTH 4 DAYS Performed at [facility], [address], [place], [place] [address]    Report Status PENDING  Incomplete  Resp Panel by RT-PCR (Flu A&B, Covid) Nasopharyngeal Swab     Status: None   Collection Time: 04/17/20 10:15 AM   Specimen: Nasopharyngeal Swab; Nasopharyngeal(NP) swabs in vial transport medium  Result Value Ref Range Status   SARS Coronavirus 2 by RT PCR NEGATIVE NEGATIVE Final    Comment: (NOTE) SARS-CoV-2 target nucleic acids are NOT DETECTED.  The SARS-CoV-2 RNA is generally detectable in upper respiratory specimens during the acute phase of infection. The lowest concentration of SARS-CoV-2 viral copies this assay can detect is 138 copies/mL. A negative result does not preclude SARS-Cov-2 infection and should not be used as the sole basis for treatment or other patient management decisions. A negative result may occur with  improper specimen collection/handling, submission of specimen other than nasopharyngeal swab, presence of viral mutation(s) within the areas targeted by this assay, and inadequate number of viral copies(<138 copies/mL). A negative result must be combined with clinical observations, patient history, and epidemiological information. The expected result is Negative.  Fact Sheet  for Patients:  BloggerCourse.com  Fact Sheet for Healthcare Providers:  SeriousBroker.it  This test is no t yet approved or cleared by the [place] FDA and  has been authorized for detection and/or diagnosis of SARS-CoV-2 by FDA under an Emergency Use Authorization (EUA). This EUA will remain  in effect (meaning this test can be used) for the duration of the COVID-19 declaration under Section 564(b)(1) of the Act, 21 U.S.C.section 360bbb-3(b)(1), unless the authorization is terminated  or revoked sooner.       Influenza A by PCR NEGATIVE NEGATIVE Final   Influenza B by PCR NEGATIVE NEGATIVE Final    Comment: (NOTE) The Xpert Xpress SARS-CoV-2/FLU/RSV plus assay is intended as an aid in the diagnosis of influenza from Nasopharyngeal swab specimens and should not be used as a sole basis for treatment. Nasal washings and aspirates are unacceptable for Xpert Xpress SARS-CoV-2/FLU/RSV testing.  Fact Sheet for Patients: BloggerCourse.com  Fact Sheet for Healthcare Providers: SeriousBroker.it  This test is not yet approved or cleared by the [place] FDA and has been authorized for detection and/or diagnosis of SARS-CoV-2 by FDA under an Emergency Use Authorization (EUA). This EUA will remain in effect (meaning this test can be used) for the duration of the COVID-19 declaration under Section 564(b)(1) of the Act, 21 U.S.C. section 360bbb-3(b)(1), unless the authorization is terminated or revoked.  Performed at [facility], [address] [place], [place] [address]   Aerobic/Anaerobic Culture w Gram Stain (surgical/deep  wound)     Status: None (Preliminary result)   Collection Time: 04/18/20  3:33 PM   Specimen: Abscess  Result Value Ref Range Status   Specimen Description ABSCESS RIGHT NECK  Final   Special Requests NONE  Final   Gram Stain   Final     ABUNDANT WBC PRESENT, PREDOMINANTLY PMN ABUNDANT GRAM POSITIVE COCCI ABUNDANT GRAM NEGATIVE RODS MODERATE GRAM POSITIVE RODS    Culture   Final    HOLDING FOR POSSIBLE ANAEROBE Performed at [facility], [address] [place], [place] [address]    Report Status PENDING  Incomplete     Time coordinating discharge:  SIGNED:   Erick Blinks, MD  [facility] 04/21/2020, 11:22 AM   If 7PM-7AM, please contact night-coverage [email or web address]

## 2020-04-21 NOTE — Progress Notes (Signed)
Progress Note - Infection  History of Present Illness: The patient developed a right submandibular/sublingual/masticator space infection about 1.5 weeks ago. He was transferred to [facility] and went to OR on 04/18/20 for extraoral I&D and removal of tooth #31.   Interval History 3/29 - Patient reports he is feeling much better than before surgery, able to open his mouth easier and swallow with less discomfort. WBC bump to 17 2/2 surgery and steroids. He has mentioned not having a BM in a few days. 3/30 - Patient reports continued improvement and has been working on jaw ROM exercises. Some jaw soreness. Continued drain output. Labs not drawn yet. Eating without difficulty. 3/31 - Patient reports continues to get better. Pain and swelling are improving as is mouth opening. WBC normalized to 9. Reports consistent drainage from drains   Clinical Exam: Extraoral Exam:  Patient is alert, orientated and in no distress CN II-XII intact Right submandibular swelling present but improved compared to pre-op; drains in neck in place x 2 with SS/purulent output still  Intraoral Exam:  The patient doeshave trismus with maximum incisal opening of 62mm. The vestibule is slightly raised The floor of mouth is slightly raised/edematous/erythematous on the right side however this is much improved  Sutures in place, extraction site closed.  Assessment/Plan:55 yoMpatient with a rightsubmandibular, sublingual, and masticatorspace infection associated with necrotic tooth #31 s/p extraoral I&D and removal of #31 on 04/18/20. Patient is doing well clinically and showing signs of improvement.   OMFS Recommendations -Patient is cleared to d/c today 04/21/20 from OMFS perspective -D/C on an additional 7 days of antibiotics (oral conversion of rocephin/flagyl) -Will leave drains in place as still moderate amount of purlent/sanguinous  discharge -Plan to follow up in clinic [facility] in [place], [place]) either tomorrow Fri 4/1 or Mon 4/4, whichever patient's preference is; will remove drains at follow up - Advance diet as tolerated -Peridex (chlorhexidine) mouthrnise QID -Change Kerlix dressing as needed   Herbie Saxon, DDS

## 2020-04-22 ENCOUNTER — Telehealth: Payer: Self-pay

## 2020-04-22 LAB — CULTURE, BLOOD (SINGLE)
Culture: NO GROWTH
Special Requests: ADEQUATE

## 2020-04-22 LAB — AEROBIC/ANAEROBIC CULTURE W GRAM STAIN (SURGICAL/DEEP WOUND)

## 2020-04-22 NOTE — Telephone Encounter (Signed)
Transition Care Management Unsuccessful Follow-up Telephone Call  Date of discharge and from where:  04/21/2020 from [place]  Attempts:  1st Attempt  Reason for unsuccessful TCM follow-up call:  Unable to leave message   

## 2020-04-25 NOTE — Telephone Encounter (Signed)
Transition Care Management Follow-up Telephone Call  Date of discharge and from where: 04/21/2020 from [facility]  How have you been since you were released from the hospital? Pt stated that he is doing well and has no questions or concerns at this time.   Any questions or concerns? No  Items Reviewed:  Did the pt receive and understand the discharge instructions provided? Yes   Medications obtained and verified? Yes   Other? No   Any new allergies since your discharge? No   Dietary orders reviewed? n/a  Do you have support at home? Yes   Functional Questionnaire: (I = Independent and D = Dependent) ADLs: I  Bathing/Dressing- I  Meal Prep- I  Eating- I  Maintaining continence- I  Transferring/Ambulation- I  Managing Meds- I   Follow up appointments reviewed:   PCP Hospital f/u appt confirmed? No    Specialist Hospital f/u appt confirmed? No    Are transportation arrangements needed? No   If their condition worsens, is the pt aware to call PCP or go to the Emergency Dept.? Yes  Was the patient provided with contact information for the PCP's office or ED? Yes  Was to pt encouraged to call back with questions or concerns? Yes

## 2020-05-24 NOTE — Op Note (Signed)
PRE-OPERATIVE DIAGNOSIS:Right submandibular, sublingual, and masticator space infection with necrotic tooth #32 POST-OPERATIVE DIAGNOSIS: Same   SURGEON: Surgeon(s) and Role: Enis Slipper, DMD - Primary  Procedure: Surgical removal [phone] x 1) Extra-oral I&D of sublingual abscess (91638) Extra-oral I&D of submandibular abscess (46659) Extra-oral I&D of masticator space abscess (93570)  Description of Operation/Procedure:  [facility] General anesthesia was induced and an nasalendotracheal tube was secured in the standard fashion. The table was moved slightly away from anesthesia and the patient was properly padded, relieving all pressure points. A formal time-out was executed. 2% lidocaine with 1:100,000 epinephrine was infiltrated into the proposed surgical sites. The patient was prepped and draped in the standard sterile fashion and a throat pack was placed.   Using an 18 gauge needle, aspiration of the swelling was performed via an extraoral approach, yielding purulence. This was sent for aerobic and anaerobic cultures and a stat Gram stain.  Attention was then directed intraorally to thelower right. A full thickness mucoperiosteal flap was elevated on both the buccal and lingual. Teeth #31were then removed with standard elevator and forceps technique without complication. The extraction sites were thoroughly curetted, bone was smoothed, and the sites thoroughly irrigated.  Transcutaneous drainage sites were then carefully marked beneath the inferior border of the mandible to allow dependent drainage, avoid neurovascular structures, and to promote esthetics. Incisions were made through skin and subcutaneous tissues and hemostasis was obtained. The platysma was identified and divided. Blunt dissection was carried to the mandible with intraoral digital guidance. The following spaces were bluntly explored  yielding purulent drainage: submandibular, sublingual, submental, masticator, and lateral pharyngeal.  Intraoral access was used to assure proper 1/4'"Penrose drain placement into each fascial space. A total of 2drains were placed and secured with 3-0 nylon sutures in the standard fashion to allow for post-surgical advancement when appropriate. All areas were thoroughly irrigated. The full thickness mucoperiosteal flap was reapproximated with 3-0 chromic gut suture. A burn net dressing was fashioned to secure kerlix fluffs over the extraoral drains.  The oral cavity was suctioned free of all debris and secretions. The teeth were brushed. The throat pack was removed and an orogastric tube was passed to evacuate the stomach contents. Sponge and needle counts were correct x 2. Care of the patient was turned over to the Anesthesia team for uneventful extubation and delivery of the patient to the PACU in stable condition.  ANESTHESIA: general EBL: 50 mL  DRAINS: Penrose drain in therightneck X 2 LOCAL MEDICATIONS USED: LIDOCAINE 2% w/ 1:100000 epi SPECIMEN: Aspirate - cultures for anaerobes, aerobes, and fungal PLAN OF CARE: Return to floor PATIENT DISPOSITION: PACU - hemodynamically stable. Delay start of Pharmacological VTE agent (>24hrs) due to surgical blood loss or risk of bleeding: no  OMFS Recommendations -Liquid diet today, advance to soft mechanical diet tomorrow -Peridex (chlorhexidine) mouthrnise QID -Change Kerlix dressing as needed -ContinueCeftriaxone/Flagyl -Follow cultures -Daily CBC w/ diff

## 2020-06-09 ENCOUNTER — Other Ambulatory Visit: Payer: Self-pay | Admitting: Physician Assistant

## 2020-06-09 NOTE — Telephone Encounter (Signed)
Please call to set up a fasting appointment this summer with Tim Welch, [facility], Thank you, Dr. Sedalia Muta

## 2020-09-09 ENCOUNTER — Other Ambulatory Visit: Payer: Self-pay | Admitting: Family Medicine

## 2020-09-21 ENCOUNTER — Other Ambulatory Visit: Payer: Self-pay | Admitting: Physician Assistant

## 2020-09-21 DIAGNOSIS — I10 Essential (primary) hypertension: Secondary | ICD-10-CM

## 2020-09-21 NOTE — Telephone Encounter (Signed)
Appt scheduled

## 2020-09-27 ENCOUNTER — Ambulatory Visit: Payer: No Typology Code available for payment source | Admitting: Physician Assistant

## 2020-09-30 ENCOUNTER — Other Ambulatory Visit: Payer: Self-pay | Admitting: Physician Assistant

## 2020-09-30 DIAGNOSIS — I10 Essential (primary) hypertension: Secondary | ICD-10-CM

## 2020-10-04 ENCOUNTER — Other Ambulatory Visit: Payer: Self-pay

## 2020-10-04 ENCOUNTER — Other Ambulatory Visit: Payer: Self-pay | Admitting: Physician Assistant

## 2020-10-04 ENCOUNTER — Ambulatory Visit: Payer: No Typology Code available for payment source | Admitting: Physician Assistant

## 2020-10-04 DIAGNOSIS — I10 Essential (primary) hypertension: Secondary | ICD-10-CM

## 2020-10-21 ENCOUNTER — Other Ambulatory Visit: Payer: Self-pay | Admitting: Physician Assistant

## 2020-10-21 DIAGNOSIS — E782 Mixed hyperlipidemia: Secondary | ICD-10-CM

## 2021-02-06 ENCOUNTER — Other Ambulatory Visit (HOSPITAL_BASED_OUTPATIENT_CLINIC_OR_DEPARTMENT_OTHER): Payer: Self-pay | Admitting: Specialist

## 2021-02-06 ENCOUNTER — Telehealth (HOSPITAL_BASED_OUTPATIENT_CLINIC_OR_DEPARTMENT_OTHER): Payer: Self-pay

## 2021-02-06 DIAGNOSIS — G500 Trigeminal neuralgia: Secondary | ICD-10-CM

## 2021-02-11 ENCOUNTER — Other Ambulatory Visit: Payer: Self-pay

## 2021-02-11 ENCOUNTER — Ambulatory Visit (HOSPITAL_BASED_OUTPATIENT_CLINIC_OR_DEPARTMENT_OTHER)
Admission: RE | Admit: 2021-02-11 | Discharge: 2021-02-11 | Disposition: A | Discharge status: Home / Self Care | Payer: No Typology Code available for payment source | Source: Ambulatory Visit | Attending: Specialist | Admitting: Specialist

## 2021-02-11 DIAGNOSIS — G500 Trigeminal neuralgia: Secondary | ICD-10-CM | POA: Insufficient documentation

## 2021-02-23 ENCOUNTER — Other Ambulatory Visit (HOSPITAL_BASED_OUTPATIENT_CLINIC_OR_DEPARTMENT_OTHER): Payer: Self-pay | Admitting: Specialist

## 2021-02-23 DIAGNOSIS — D892 Hypergammaglobulinemia, unspecified: Secondary | ICD-10-CM

## 2021-03-14 ENCOUNTER — Ambulatory Visit (HOSPITAL_BASED_OUTPATIENT_CLINIC_OR_DEPARTMENT_OTHER)
Admission: RE | Admit: 2021-03-14 | Discharge: 2021-03-14 | Disposition: A | Discharge status: Home / Self Care | Payer: No Typology Code available for payment source | Source: Ambulatory Visit | Attending: Specialist | Admitting: Specialist

## 2021-03-14 ENCOUNTER — Other Ambulatory Visit: Payer: Self-pay

## 2021-03-14 DIAGNOSIS — D892 Hypergammaglobulinemia, unspecified: Secondary | ICD-10-CM | POA: Diagnosis not present

## 2022-03-27 ENCOUNTER — Ambulatory Visit (INDEPENDENT_AMBULATORY_CARE_PROVIDER_SITE_OTHER): Payer: PRIVATE HEALTH INSURANCE | Admitting: Family Medicine

## 2022-03-27 ENCOUNTER — Encounter: Payer: Self-pay | Admitting: Family Medicine

## 2022-03-27 ENCOUNTER — Other Ambulatory Visit: Payer: Self-pay | Admitting: Family Medicine

## 2022-03-27 VITALS — BP 130/82 | HR 78 | Temp 98.2°F | Ht 74.0 in | Wt 282.2 lb

## 2022-03-27 DIAGNOSIS — Z0000 Encounter for general adult medical examination without abnormal findings: Secondary | ICD-10-CM | POA: Diagnosis not present

## 2022-03-27 DIAGNOSIS — E538 Deficiency of other specified B group vitamins: Secondary | ICD-10-CM

## 2022-03-27 DIAGNOSIS — G500 Trigeminal neuralgia: Secondary | ICD-10-CM | POA: Diagnosis not present

## 2022-03-27 DIAGNOSIS — Z23 Encounter for immunization: Secondary | ICD-10-CM

## 2022-03-27 DIAGNOSIS — Z125 Encounter for screening for malignant neoplasm of prostate: Secondary | ICD-10-CM

## 2022-03-27 DIAGNOSIS — E7801 Familial hypercholesterolemia: Secondary | ICD-10-CM

## 2022-03-27 DIAGNOSIS — I10 Essential (primary) hypertension: Secondary | ICD-10-CM

## 2022-03-27 LAB — COMPREHENSIVE METABOLIC PANEL
ALT: 32 U/L (ref 0–53)
AST: 23 U/L (ref 0–37)
Albumin: 4.4 g/dL (ref 3.5–5.2)
Alkaline Phosphatase: 63 U/L (ref 39–117)
BUN: 16 mg/dL (ref 6–23)
CO2: 29 mEq/L (ref 19–32)
Calcium: 9.8 mg/dL (ref 8.4–10.5)
Chloride: 101 mEq/L (ref 96–112)
Creatinine, Ser: 1.01 mg/dL (ref 0.40–1.50)
GFR: 81.64 mL/min (ref >60.00)
Glucose, Bld: 74 mg/dL (ref 70–99)
Potassium: 4.5 mEq/L (ref 3.5–5.1)
Sodium: 140 mEq/L (ref 135–145)
Total Bilirubin: 0.9 mg/dL (ref 0.2–1.2)
Total Protein: 6.8 g/dL (ref 6.0–8.3)

## 2022-03-27 LAB — CBC
HCT: 46.2 % (ref 39.0–52.0)
Hemoglobin: 15.8 g/dL (ref 13.0–17.0)
MCHC: 34.3 g/dL (ref 30.0–36.0)
MCV: 86.7 fl (ref 78.0–100.0)
Platelets: 221 10*3/uL (ref 150.0–400.0)
RBC: 5.33 Mil/uL (ref 4.22–5.81)
RDW: 14.1 % (ref 11.5–15.5)
WBC: 8.6 10*3/uL (ref 4.0–10.5)

## 2022-03-27 LAB — LIPID PANEL
Cholesterol: 210 mg/dL — ABNORMAL HIGH (ref 0–200)
HDL: 41.8 mg/dL (ref >39.00)
LDL Cholesterol: 142 mg/dL — ABNORMAL HIGH (ref 0–99)
NonHDL: 168.21
Total CHOL/HDL Ratio: 5
Triglycerides: 133 mg/dL (ref 0.0–149.0)
VLDL: 26.6 mg/dL (ref 0.0–40.0)

## 2022-03-27 LAB — PSA: PSA: 4.89 ng/mL — ABNORMAL HIGH (ref 0.10–4.00)

## 2022-03-27 LAB — VITAMIN B12: Vitamin B-12: 985 pg/mL — ABNORMAL HIGH (ref 211–911)

## 2022-03-27 MED ORDER — ATORVASTATIN CALCIUM 20 MG PO TABS: 20.0000 mg | ORAL_TABLET | Freq: Every day | ORAL | 3 refills | Status: DC

## 2022-03-27 MED ORDER — GABAPENTIN 300 MG PO CAPS: 300.0000 mg | ORAL_CAPSULE | Freq: Three times a day (TID) | ORAL | 3 refills | Status: DC

## 2022-03-27 NOTE — Progress Notes (Signed)
Chief Complaint  Patient presents with   New Patient (Initial Visit)    Well Male Tim Welch is here for a complete physical.   His last physical was >1 year ago.  Current diet: in general, a "healthy" diet.  Current exercise: none Weight trend: increased a little Fatigue out of ordinary? No. Seat belt? Yes.   Advanced directive? No  Health maintenance Shingrix- No Colonoscopy- Yes Tetanus- Due HIV- Yes Hep C- Yes   Past Medical History:  Diagnosis Date   Complication of anesthesia    Essential (primary) hypertension    Hyperlipidemia    Hypertension    Mixed hyperlipidemia    Neuralgia and neuritis, unspecified    PONV (postoperative nausea and vomiting)       Past Surgical History:  Procedure Laterality Date   TOOTH EXTRACTION  04/18/2020   Procedure: INCISION AND DRAINAGE OF FACIAL ABSCESS AND EXTRACTION OF TOOTH #31;  Surgeon: Newt Lukes, DMD;  Location: [facility];  Service: Dentistry;;   tosillectomy      Medications  Current Outpatient Medications on File Prior to Visit  Medication Sig Dispense Refill   atorvastatin (LIPITOR) 10 MG tablet TAKE 1 TABLET BY MOUTH EVERY DAY FOR CHOLESTEROL 90 tablet 0   hydrochlorothiazide (HYDRODIURIL) 25 MG tablet TAKE 1 TABLET BY MOUTH EVERY DAY 90 tablet 0   losartan (COZAAR) 100 MG tablet Take 1 tablet (100 mg total) by mouth daily. Needs appointment for further refills. 30 tablet 0   sildenafil (REVATIO) 20 MG tablet 1 tab as needed prior to sex 30 tablet 0   Allergies Allergies  Allergen Reactions   Penicillins Hives    Did it involve swelling of the face/tongue/throat, SOB, or low BP? N Did it involve sudden or severe rash/hives, skin peeling, or any reaction on the inside of your mouth or nose? Y Did you need to seek medical attention at a hospital or doctor's office? N When did it last happen?  Several Years Ago     If all above answers are "NO", may proceed with cephalosporin use.     Family History Family  History  Problem Relation Age of Onset   Lung cancer Mother    Hypertension Father    Hyperlipidemia Father    Cancer Father     Review of Systems: Constitutional:  no fevers Eye:  no recent significant change in vision Ear/Nose/Mouth/Throat:  Ears:  no hearing loss Nose/Mouth/Throat:  no complaints of nasal congestion, no sore throat Cardiovascular:  no chest pain Respiratory:  no shortness of breath Gastrointestinal:  no change in bowel habits GU:  Male: negative for dysuria, frequency Musculoskeletal/Extremities:  no new joint pain Integumentary (Skin/Breast):  no abnormal skin lesions reported Neurologic:  no headaches Endocrine: No unexpected weight changes Hematologic/Lymphatic:  no abnormal bleeding  Exam BP 130/82 (BP Location: Left Arm, Patient Position: Sitting, Cuff Size: Normal)   Pulse 78   Temp 98.2 F (36.8 C) (Oral)   Ht '6\' 2"'$  (1.88 m)   Wt 282 lb 4 oz (128 kg)   SpO2 99%   BMI 36.24 kg/m  General:  well developed, well nourished, in no apparent distress Skin:  no significant moles, warts, or growths Head:  no masses, lesions, or tenderness Eyes:  pupils equal and round, sclera anicteric without injection Ears:  canals without lesions, TMs shiny without retraction, no obvious effusion, no erythema Nose:  nares patent, mucosa normal Throat/Pharynx:  lips and gingiva without lesion; tongue and uvula midline; non-inflamed pharynx;  no exudates or postnasal drainage Neck: neck supple without adenopathy, thyromegaly, or masses Cardiac: RRR, no bruits, no LE edema Lungs:  clear to auscultation, breath sounds equal bilaterally, no respiratory distress Abdomen: BS+, soft, non-tender, non-distended, no masses or organomegaly noted Rectal: Deferred Musculoskeletal:  symmetrical muscle groups noted without atrophy or deformity Neuro:  gait normal; deep tendon reflexes normal and symmetric Psych: well oriented with normal range of affect and appropriate  judgment/insight  Assessment and Plan  Well adult exam - Plan: Comprehensive metabolic panel, CBC, Lipid panel  Trigeminal neuralgia  Screening for prostate cancer - Plan: PSA  Low serum vitamin B12 - Plan: B12  Essential (primary) hypertension  Familial hypercholesterolemia  Need for Tdap vaccination - Plan: Tdap vaccine greater than or equal to 27yo IM   Well 57 y.o. male. Counseled on diet and exercise. Counseled on risks and benefits of prostate cancer screening with PSA. The patient agrees to undergo testing. Immunizations, labs, and further orders as above. Advanced directive form requested today.  Shingrix rec'd. Tdap today.  Follow up in 6 mo. The patient voiced understanding and agreement to the plan.  [place], DO 03/27/22 2:27 PM

## 2022-03-27 NOTE — Patient Instructions (Signed)
Give us 2-3 business days to get the results of your labs back.   Keep the diet clean and stay active.  Please get me a copy of your advanced directive form at your convenience.   Let us know if you need anything.  

## 2022-03-28 ENCOUNTER — Encounter: Payer: Self-pay | Admitting: Family Medicine

## 2022-03-28 ENCOUNTER — Other Ambulatory Visit: Payer: Self-pay | Admitting: Family Medicine

## 2022-03-28 DIAGNOSIS — E7801 Familial hypercholesterolemia: Secondary | ICD-10-CM

## 2022-03-28 DIAGNOSIS — R972 Elevated prostate specific antigen [PSA]: Secondary | ICD-10-CM

## 2022-03-28 DIAGNOSIS — Z1211 Encounter for screening for malignant neoplasm of colon: Secondary | ICD-10-CM

## 2022-03-28 NOTE — Addendum Note (Signed)
Addended by: Sharon Seller B on: 03/28/2022 04:33 PM   Modules accepted: Orders

## 2022-04-05 NOTE — Telephone Encounter (Signed)
Referral placed.

## 2022-04-05 NOTE — Telephone Encounter (Signed)
Pt called and he is still wanting a gi referral.

## 2022-04-27 MED ORDER — GABAPENTIN 300 MG PO CAPS: 300.0000 mg | ORAL_CAPSULE | Freq: Three times a day (TID) | ORAL | 3 refills | Status: DC

## 2022-04-27 NOTE — Addendum Note (Signed)
Addended by: Scharlene Gloss B on: 04/27/2022 07:10 AM   Modules accepted: Orders

## 2022-05-14 ENCOUNTER — Other Ambulatory Visit (INDEPENDENT_AMBULATORY_CARE_PROVIDER_SITE_OTHER): Payer: PRIVATE HEALTH INSURANCE

## 2022-05-14 ENCOUNTER — Other Ambulatory Visit: Payer: Self-pay | Admitting: Family Medicine

## 2022-05-14 DIAGNOSIS — E7801 Familial hypercholesterolemia: Secondary | ICD-10-CM

## 2022-05-14 DIAGNOSIS — R972 Elevated prostate specific antigen [PSA]: Secondary | ICD-10-CM

## 2022-05-14 LAB — LIPID PANEL
Cholesterol: 173 mg/dL (ref 0–200)
HDL: 34.9 mg/dL — ABNORMAL LOW (ref >39.00)
LDL Cholesterol: 106 mg/dL — ABNORMAL HIGH (ref 0–99)
NonHDL: 137.76
Total CHOL/HDL Ratio: 5
Triglycerides: 157 mg/dL — ABNORMAL HIGH (ref 0.0–149.0)
VLDL: 31.4 mg/dL (ref 0.0–40.0)

## 2022-05-14 LAB — HEPATIC FUNCTION PANEL
ALT: 32 U/L (ref 0–53)
AST: 25 U/L (ref 0–37)
Albumin: 4.6 g/dL (ref 3.5–5.2)
Alkaline Phosphatase: 59 U/L (ref 39–117)
Bilirubin, Direct: 0.2 mg/dL (ref 0.0–0.3)
Total Bilirubin: 0.8 mg/dL (ref 0.2–1.2)
Total Protein: 6.9 g/dL (ref 6.0–8.3)

## 2022-05-14 LAB — PSA: PSA: 3.99 ng/mL (ref 0.10–4.00)

## 2022-05-19 ENCOUNTER — Other Ambulatory Visit: Payer: Self-pay | Admitting: Family Medicine

## 2022-05-19 DIAGNOSIS — I10 Essential (primary) hypertension: Secondary | ICD-10-CM

## 2022-05-24 ENCOUNTER — Encounter: Payer: Self-pay | Admitting: Family Medicine

## 2022-05-24 ENCOUNTER — Other Ambulatory Visit: Payer: Self-pay | Admitting: Family Medicine

## 2022-05-24 MED ORDER — GABAPENTIN 300 MG PO CAPS: ORAL_CAPSULE | ORAL | 3 refills | Status: DC

## 2022-06-05 IMAGING — MR MR HEAD W/O CM
5 of 6 series · 33 of 48 positions shown · non-contrast
Comparison: Neck CT 04/17/2020. Face CT 11/27/2019.

CLINICAL DATA: 55-year-old male with a history of chronic/recurrent
left jaw and chin pain. Symptom onset after fall skiing 2.5 years
ago. Right submandibular space infection last year thought to be
odontogenic.

EXAM:
MRI HEAD WITHOUT CONTRAST
TECHNIQUE: Multiplanar, multiecho pulse sequences of the face were obtained
without intravenous contrast.

[Series 2: T1 · sagittal · 3.0mm · 0.78mm/px · 7 of 41 slices shown]
[im 1/41]
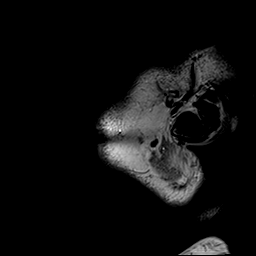
[im 7/41]
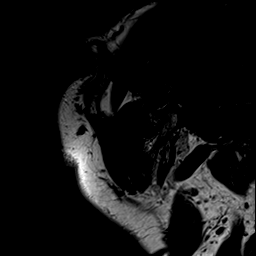
[im 14/41]
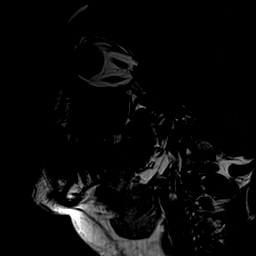
[im 21/41]
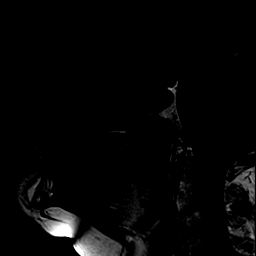
[im 27/41]
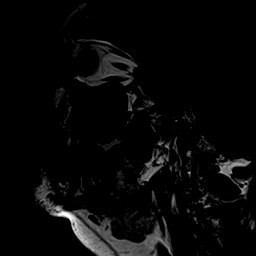
[im 34/41]
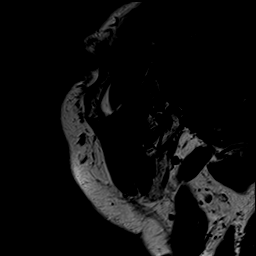
[im 41/41]
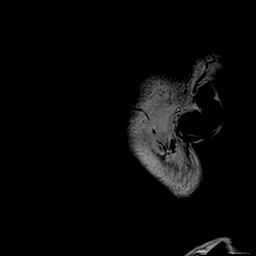

[Series 3: T2 · coronal · 3.0mm · 0.78mm/px · 6 of 40 slices shown]
[im 1/40]
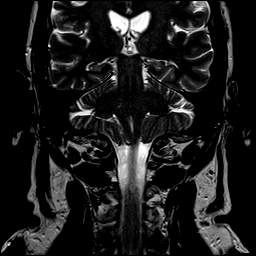
[im 8/40]
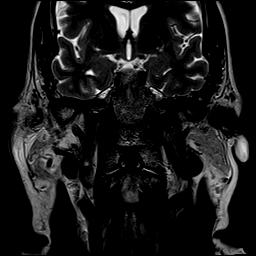
[im 16/40]
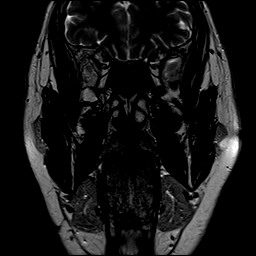
[im 24/40]
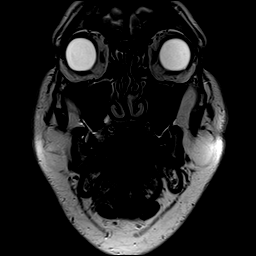
[im 32/40]
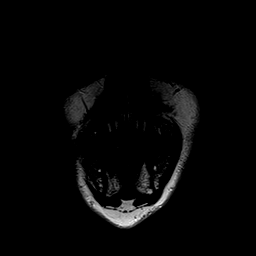
[im 40/40]
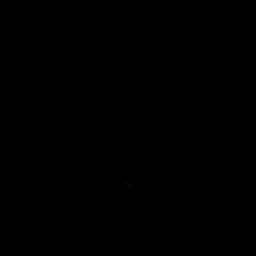

[Series 4: STIR · axial · 3.0mm · 0.39mm/px · z∈[-88,+52]mm · 5 of 36 slices shown]
[im 1/36]
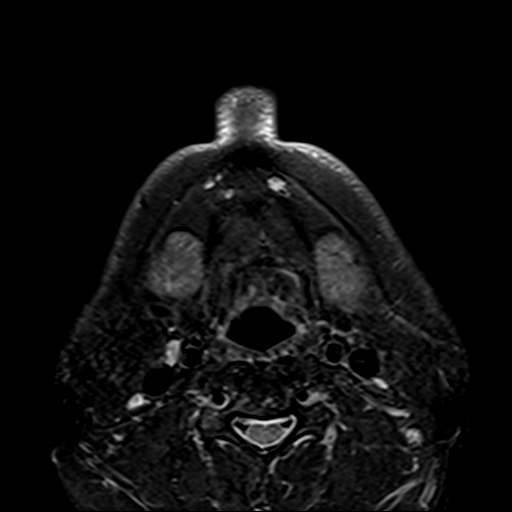
[im 9/36]
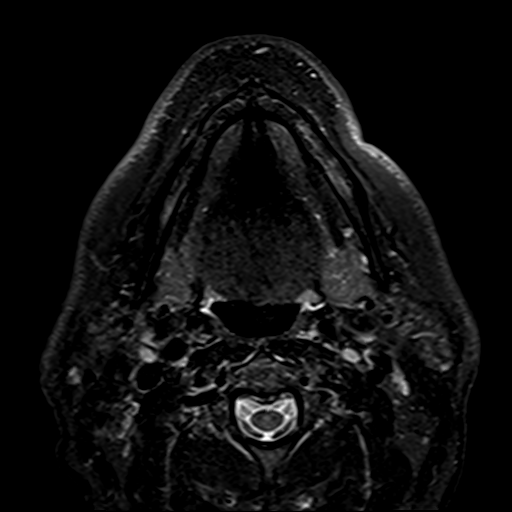
[im 18/36]
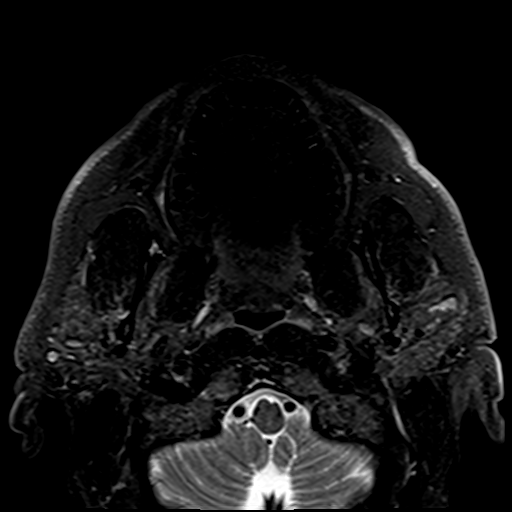
[im 27/36]
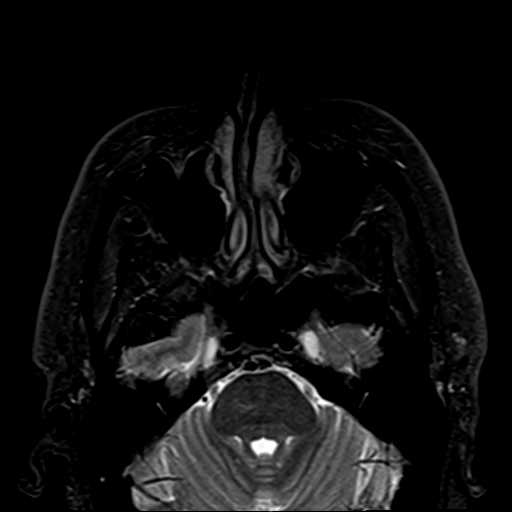
[im 36/36]
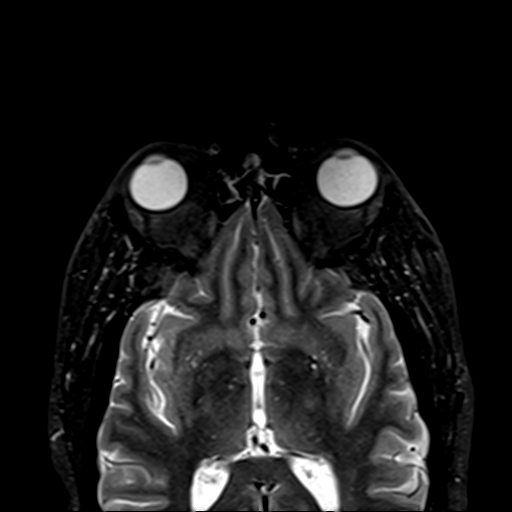

[Series 5: t2_spc_tra_p2_iso · axial · 1.0mm · 0.78mm/px · z∈[-72,+40]mm · 9 of 128 slices shown]
[im 8/128]
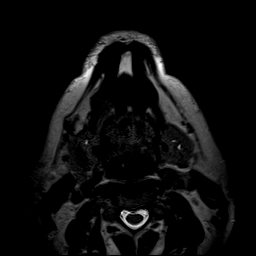
[im 22/128]
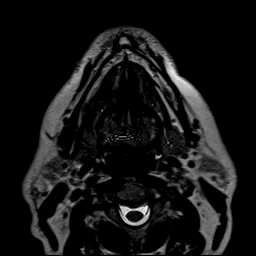
[im 36/128]
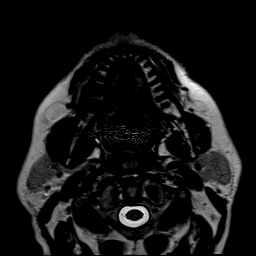
[im 57/128]
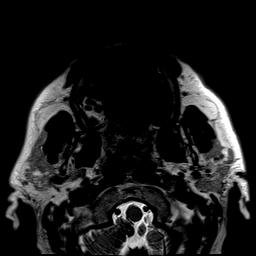
[im 64/128]
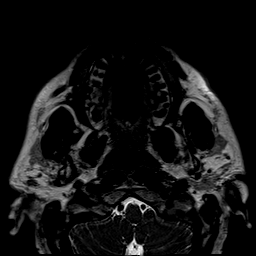
[im 71/128]
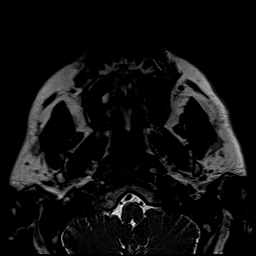
[im 92/128]
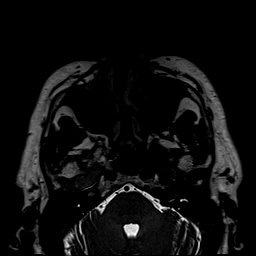
[im 106/128]
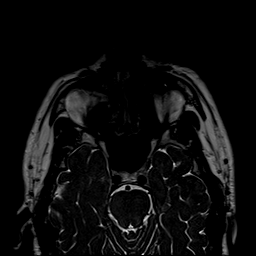
[im 120/128]
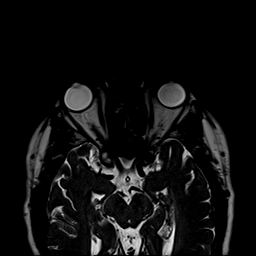

[Series 7: t1_se_cor_3mm · coronal · 3.0mm · 0.39mm/px · 6 of 40 slices shown]
[im 1/40]
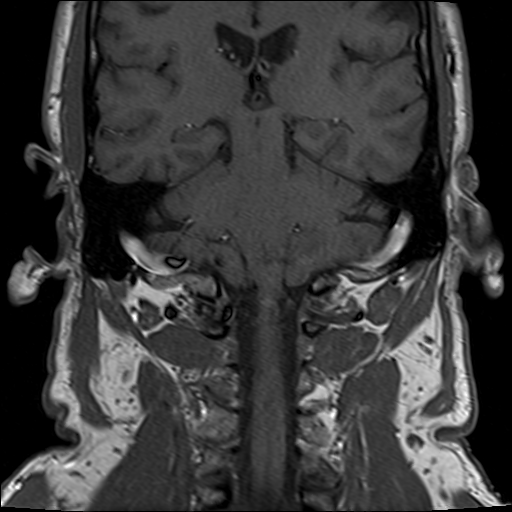
[im 8/40]
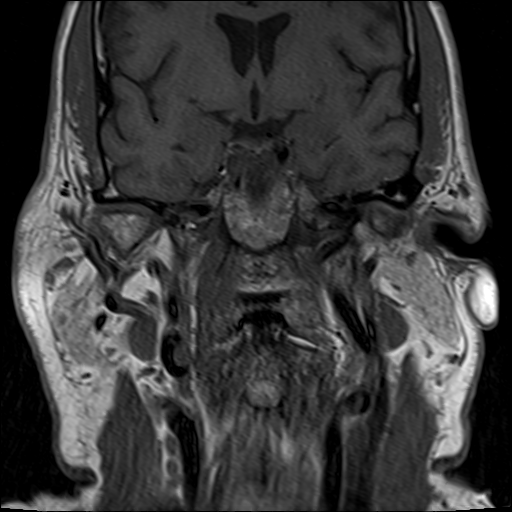
[im 16/40]
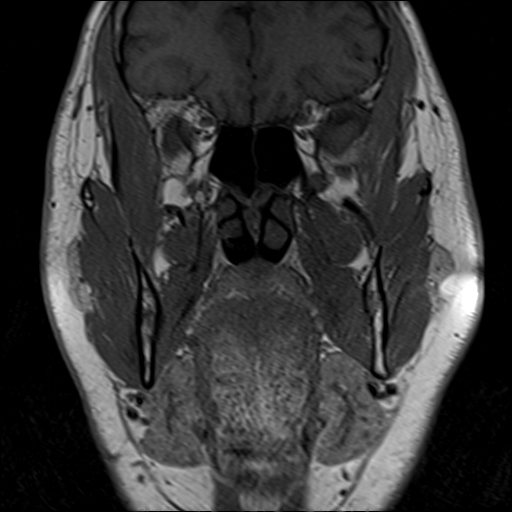
[im 24/40]
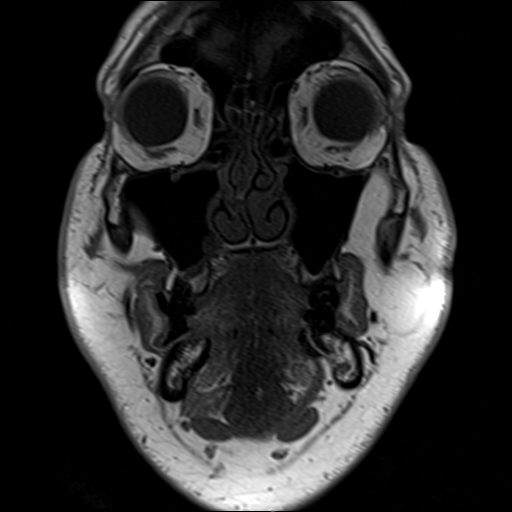
[im 32/40]
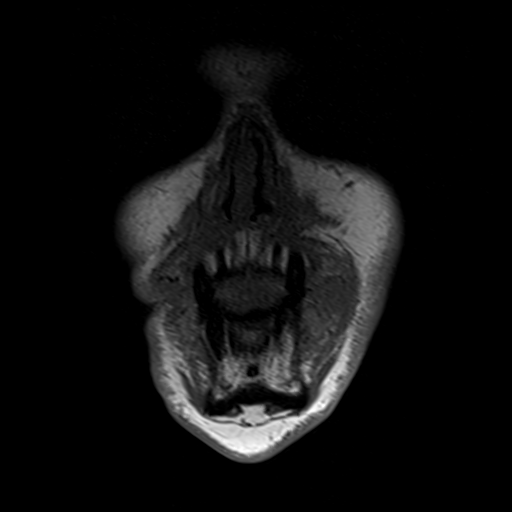
[im 40/40]
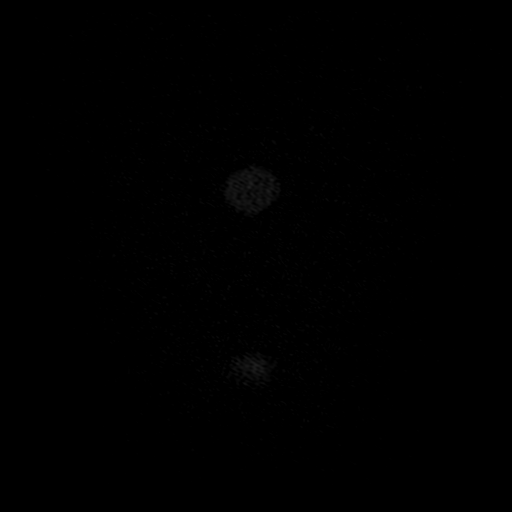

[33 of 48 positions shown; findings below may reference images not displayed]

FINDINGS: Pharynx and larynx: Normal visible supraglottic larynx, epiglottis.
Negative noncontrast pharynx; there is evidence of a tiny 4 mm left
nasopharynx mucous retention cyst on series 4, image 21.
Parapharyngeal and retropharyngeal spaces are normal.

Salivary glands: Normalized right submandibular space since last
year. Submandibular glands appear symmetric and within normal
limits. Sublingual space, sublingual glands, and parotid glands are
within normal limits; there is mild asymmetric right parotid
atrophy.

Masticator spaces appear normal. No inflammation identified in the
deep soft tissue spaces of the face.

Lymph nodes: Negative. No upper cervical lymphadenopathy.

Vascular: The major vascular flow voids in the neck and at the skull
base are preserved.

Limited intracranial: Negative. Brainstem appears normal.
Cervicomedullary junction is within normal limits.

Visualized orbits: Negative.

Mastoids and visualized paranasal sinuses: Only trace paranasal
sinus mucosal thickening, such as in the right maxillary alveolar
recess, not significantly changed since 6961. Mastoids are clear.
Visible internal auditory structures appear normal.

Chronic right nasal septal deviation and spurring.

Skeleton: Mandible bone marrow signal appears normal aside from
right posterior molar tooth extraction site which appears new from
last year. No acute dental abnormality is evident. Symmetric
appearance of the bilateral inferior alveolar nerves.

Other bone marrow signal in the visible face, at the skull base, and
cervical spine appears normal. No skull base abnormality identified.
IMPRESSION: 1. Negative noncontrast MRI appearance of the Face.
Normalized right sublingual and submandibular spaces following
resolution of the inflammatory process seen last year, with interval
dental extraction of the right posterior mandible molar.

2. No explanation for face or jaw pain.

## 2022-08-10 ENCOUNTER — Other Ambulatory Visit: Payer: Self-pay | Admitting: Family Medicine

## 2022-08-10 DIAGNOSIS — I10 Essential (primary) hypertension: Secondary | ICD-10-CM

## 2022-08-27 ENCOUNTER — Other Ambulatory Visit: Payer: Self-pay | Admitting: Family Medicine

## 2022-08-27 ENCOUNTER — Other Ambulatory Visit (INDEPENDENT_AMBULATORY_CARE_PROVIDER_SITE_OTHER): Payer: PRIVATE HEALTH INSURANCE

## 2022-08-27 DIAGNOSIS — R972 Elevated prostate specific antigen [PSA]: Secondary | ICD-10-CM | POA: Diagnosis not present

## 2022-08-27 DIAGNOSIS — E7801 Familial hypercholesterolemia: Secondary | ICD-10-CM | POA: Diagnosis not present

## 2022-08-27 LAB — PSA: PSA: 4.25 ng/mL — ABNORMAL HIGH (ref 0.10–4.00)

## 2022-08-27 LAB — LIPID PANEL
Cholesterol: 190 mg/dL (ref 0–200)
HDL: 39.3 mg/dL (ref >39.00)
LDL Cholesterol: 116 mg/dL — ABNORMAL HIGH (ref 0–99)
NonHDL: 150.52
Total CHOL/HDL Ratio: 5
Triglycerides: 173 mg/dL — ABNORMAL HIGH (ref 0.0–149.0)
VLDL: 34.6 mg/dL (ref 0.0–40.0)

## 2022-09-07 ENCOUNTER — Encounter: Payer: Self-pay | Admitting: Urology

## 2022-09-07 ENCOUNTER — Ambulatory Visit (INDEPENDENT_AMBULATORY_CARE_PROVIDER_SITE_OTHER): Payer: PRIVATE HEALTH INSURANCE | Admitting: Urology

## 2022-09-07 VITALS — BP 158/83 | HR 69 | Ht 74.0 in | Wt 270.0 lb

## 2022-09-07 DIAGNOSIS — R972 Elevated prostate specific antigen [PSA]: Secondary | ICD-10-CM | POA: Insufficient documentation

## 2022-09-07 DIAGNOSIS — Z8042 Family history of malignant neoplasm of prostate: Secondary | ICD-10-CM | POA: Diagnosis not present

## 2022-09-07 DIAGNOSIS — R3129 Other microscopic hematuria: Secondary | ICD-10-CM

## 2022-09-07 LAB — URINALYSIS, ROUTINE W REFLEX MICROSCOPIC
Bilirubin, UA: NEGATIVE
Glucose, UA: NEGATIVE
Ketones, UA: NEGATIVE
Leukocytes,UA: NEGATIVE
Nitrite, UA: NEGATIVE
Protein,UA: NEGATIVE
Specific Gravity, UA: 1.02 (ref 1.005–1.030)
Urobilinogen, Ur: 0.2 mg/dL (ref 0.2–1.0)
pH, UA: 5.5 (ref 5.0–7.5)

## 2022-09-07 LAB — MICROSCOPIC EXAMINATION

## 2022-09-07 NOTE — Progress Notes (Signed)
Assessment: 1. Elevated PSA   2. Family history of malignant neoplasm of prostate   3. Microscopic hematuria; prior negative evaluation     Plan: I personally reviewed the patient's chart including provider notes, and lab results. Today I had a long discussion with the patient regarding PSA and the rationale and controversies of prostate cancer early detection.  I discussed the pros and cons of further evaluation including TRUS and prostate Bx.  Potential adverse events and complications as well as standard instructions were given.  Patient expressed his understanding of these issues. Schedule for TRUS/BX - he would like to wait until after his upcoming craniotomy Will schedule for early October  Chief Complaint:  Chief Complaint  Patient presents with   Elevated PSA    History of Present Illness:  Tim Welch is a 57 y.o. male who is seen in consultation from [place], DO for evaluation of elevated PSA. PSA results: 11/18 2.24 6/19 2.15 3/24 4.89 4/24 3.99 8/24 4.25  No prior history of elevated PSA.  No history of prostatitis or UTI.  He has a family history of prostate cancer with his father who is treated with a radical prostatectomy. He ports intermittent urinary symptoms including some decreased force of stream urgency and nocturia.  He typically notices his symptoms after riding a bike.  No dysuria or gross hematuria. IPSS = 10 today.  He has a history of microscopic hematuria with occasional gross hematuria, typically after ejaculation.  He was previously evaluated by Dr. Edwin Cap.  He has undergone multiple cystoscopies and CTs which were all negative.   Past Medical History:  Past Medical History:  Diagnosis Date   Complication of anesthesia    Essential (primary) hypertension    Hyperlipidemia    Hypertension    Mixed hyperlipidemia    Neuralgia and neuritis, unspecified    PONV (postoperative nausea and vomiting)     Past Surgical History:   Past Surgical History:  Procedure Laterality Date   TOOTH EXTRACTION  04/18/2020   Procedure: INCISION AND DRAINAGE OF FACIAL ABSCESS AND EXTRACTION OF TOOTH #31;  Surgeon: Enis Slipper, DMD;  Location[facility];  Service: Dentistry;;   tosillectomy      Allergies:  Allergies  Allergen Reactions   Penicillins Hives and Rash    Did it involve swelling of the face/tongue/throat, SOB, or low BP? N  Did it involve sudden or severe rash/hives, skin peeling, or any reaction on the inside of your mouth or nose? Y  Did you need to seek medical attention at a hospital or doctor's office? N  When did it last happen?  Several Years Ago      If all above answers are "NO", may proceed with cephalosporin use.  Did it involve swelling of the face/tongue/throat, SOB, or low BP? N Did it involve sudden or severe rash/hives, skin peeling, or any reaction on the inside of your mouth or nose? Y Did you need to seek medical attention at a hospital or doctor's office? N When did it last happen?  Several Years Ago     If all above answers are "NO", may proceed with cephalosporin use.    Family History:  Family History  Problem Relation Age of Onset   Lung cancer Mother    Hypertension Father    Hyperlipidemia Father    Cancer Father     Social History:  Social History   Tobacco Use   Smoking status: Former    Current  packs/day: 0.00    Average packs/day: 3.0 packs/day for 25.0 years (75.0 ttl pk-yrs)    Types: Cigarettes    Start date: 43    Quit date: 2008    Years since quitting: 16.6   Smokeless tobacco: Never  Vaping Use   Vaping status: Never Used  Substance Use Topics   Alcohol use: Yes    Comment: Drinks alcohol on a social basis.   Drug use: No    Review of symptoms:  Constitutional:  Negative for unexplained weight loss, night sweats, fever, chills ENT:  Negative for nose bleeds, sinus pain, painful swallowing CV:  Negative for chest pain, shortness of breath, exercise  intolerance, palpitations, loss of consciousness Resp:  Negative for cough, wheezing, shortness of breath GI:  Negative for nausea, vomiting, diarrhea, bloody stools GU:  Positives noted in HPI; otherwise negative for gross hematuria, dysuria, urinary incontinence Neuro:  Negative for seizures, poor balance, limb weakness, slurred speech Psych:  Negative for lack of energy, depression, anxiety Endocrine:  Negative for polydipsia, polyuria, symptoms of hypoglycemia (dizziness, hunger, sweating) Hematologic:  Negative for anemia, purpura, petechia, prolonged or excessive bleeding, use of anticoagulants  Allergic:  Negative for difficulty breathing or choking as a result of exposure to anything; no shellfish allergy; no allergic response (rash/itch) to materials, foods  Physical exam: BP (!) 158/83   Pulse 69   Ht 6\' 2"  (1.88 m)   Wt 270 lb (122.5 kg)   BMI 34.67 kg/m  GENERAL APPEARANCE:  Well appearing, well developed, well nourished, NAD HEENT: Atraumatic, Normocephalic, oropharynx clear. NECK: Supple without lymphadenopathy or thyromegaly. LUNGS: Clear to auscultation bilaterally. HEART: Regular Rate and Rhythm without murmurs, gallops, or rubs. ABDOMEN: Soft, non-tender, No Masses. EXTREMITIES: Moves all extremities well.  Without clubbing, cyanosis, or edema. NEUROLOGIC:  Alert and oriented x 3, normal gait, CN II-XII grossly intact.  MENTAL STATUS:  Appropriate. BACK:  Non-tender to palpation.  No CVAT SKIN:  Warm, dry and intact.   GU: Penis:  circumcised Meatus: Normal Scrotum: normal, no masses Testis: normal without masses bilateral Epididymis: normal Prostate: 40 g, NT, no nodules Rectum: Normal tone,  no masses or tenderness   Results: U/A: 0-5 WBC, 0-2 RBC

## 2022-09-20 HISTORY — PX: CEREBRAL MICROVASCULAR DECOMPRESSION: SHX1328

## 2022-10-24 ENCOUNTER — Ambulatory Visit (HOSPITAL_BASED_OUTPATIENT_CLINIC_OR_DEPARTMENT_OTHER): Payer: PRIVATE HEALTH INSURANCE

## 2022-10-24 ENCOUNTER — Encounter (HOSPITAL_BASED_OUTPATIENT_CLINIC_OR_DEPARTMENT_OTHER): Payer: Self-pay

## 2022-10-24 ENCOUNTER — Other Ambulatory Visit: Payer: PRIVATE HEALTH INSURANCE | Admitting: Urology

## 2022-10-25 ENCOUNTER — Telehealth: Payer: Self-pay | Admitting: Gastroenterology

## 2022-10-25 NOTE — Telephone Encounter (Signed)
Good morning Dr. Meridee Score,   We received a referral for patient to have a colonoscopy done. He does have GI history with [facility] stated they no longer take his insurance and his wife is a current patient of yours. Was able to obtain previous colonoscopy reports. Records have been scanned int [facility] for you to review and advise on scheduling.    Thank you

## 2022-10-26 NOTE — Telephone Encounter (Signed)
The patient is accepted to the [facility] GI service for treatment. Looks like he previously had a colonoscopy back in 2015 with multiple polyps removed was a large 1 reported to requiring 30 mm snare.  Pathology is not present.  However it is recommended a repeat colonoscopy which she had in 2017.  His bowel preparations have always been noted to be inadequate. Would plan for patient to be scheduled next available colonoscopy in the [facility] with me. Needs to be on 1 week of MiraLAX daily plus Dulcolax every other day before bowel preparation begins versus doing a 2-day bowel preparation. We will get him back on a regular scheduled colonoscopy interval thereafter, but presumably no more than every 5 years due to what was presumably an advanced adenoma years ago. Thanks. GM

## 2022-11-01 ENCOUNTER — Encounter: Payer: Self-pay | Admitting: Gastroenterology

## 2022-11-01 NOTE — Telephone Encounter (Signed)
Called patient to advise and schedule left voicemail. 

## 2022-11-07 ENCOUNTER — Ambulatory Visit: Payer: PRIVATE HEALTH INSURANCE | Admitting: Urology

## 2022-11-07 ENCOUNTER — Other Ambulatory Visit (HOSPITAL_BASED_OUTPATIENT_CLINIC_OR_DEPARTMENT_OTHER): Payer: Self-pay | Admitting: Urology

## 2022-11-07 ENCOUNTER — Ambulatory Visit (HOSPITAL_BASED_OUTPATIENT_CLINIC_OR_DEPARTMENT_OTHER)
Admission: RE | Admit: 2022-11-07 | Discharge: 2022-11-07 | Disposition: A | Discharge status: Home / Self Care | Payer: PRIVATE HEALTH INSURANCE | Source: Ambulatory Visit | Attending: Urology | Admitting: Urology

## 2022-11-07 VITALS — BP 164/90 | HR 74 | Ht 74.0 in | Wt 270.0 lb

## 2022-11-07 DIAGNOSIS — R972 Elevated prostate specific antigen [PSA]: Secondary | ICD-10-CM | POA: Insufficient documentation

## 2022-11-07 DIAGNOSIS — Z8042 Family history of malignant neoplasm of prostate: Secondary | ICD-10-CM | POA: Diagnosis not present

## 2022-11-07 DIAGNOSIS — Z2989 Encounter for other specified prophylactic measures: Secondary | ICD-10-CM | POA: Diagnosis not present

## 2022-11-07 DIAGNOSIS — N4231 Prostatic intraepithelial neoplasia: Secondary | ICD-10-CM

## 2022-11-07 LAB — URINALYSIS, ROUTINE W REFLEX MICROSCOPIC
Bilirubin, UA: NEGATIVE
Glucose, UA: NEGATIVE
Ketones, UA: NEGATIVE
Nitrite, UA: NEGATIVE
Specific Gravity, UA: >1.03 — ABNORMAL HIGH (ref 1.005–1.030)
Urobilinogen, Ur: 0.2 mg/dL (ref 0.2–1.0)
pH, UA: 6.5 (ref 5.0–7.5)

## 2022-11-07 LAB — MICROSCOPIC EXAMINATION

## 2022-11-07 MED ORDER — CEFTRIAXONE SODIUM 1 G IJ SOLR
1.0000 g | Freq: Once | INTRAMUSCULAR | Status: AC
Start: 2022-11-07 — End: 2022-11-07
  Administered 2022-11-07: 1 g via INTRAMUSCULAR

## 2022-11-07 NOTE — Progress Notes (Signed)
Assessment: 1. Elevated PSA   2. Family history of malignant neoplasm of prostate     Plan: Post biopsy instructions given Return to office in 7-10 days for biopsy results  Chief Complaint:  Chief Complaint  Patient presents with   Elevated PSA    History of Present Illness:  Tim Welch is a 57 y.o. male who presents for further evaluation of elevated PSA. PSA results: 11/18 2.24 6/19 2.15 3/24 4.89 4/24 3.99 8/24 4.25  No prior history of elevated PSA.  No history of prostatitis or UTI.  He has a family history of prostate cancer with his father who is treated with a radical prostatectomy. He reported intermittent urinary symptoms including some decreased force of stream urgency and nocturia.  He typically notices his symptoms after riding a bike.  No dysuria or gross hematuria. IPSS = 10.  He has a history of microscopic hematuria with occasional gross hematuria, typically after ejaculation.  He was previously evaluated by Dr. Edwin Cap.  He has undergone multiple cystoscopies and CTs which were all negative.  He presents today for prostate biopsy.  Portions of the above documentation were copied from a prior visit for review purposes only.   Past Medical History:  Past Medical History:  Diagnosis Date   Complication of anesthesia    Essential (primary) hypertension    Hyperlipidemia    Hypertension    Mixed hyperlipidemia    Neuralgia and neuritis, unspecified    PONV (postoperative nausea and vomiting)     Past Surgical History:  Past Surgical History:  Procedure Laterality Date   TOOTH EXTRACTION  04/18/2020   Procedure: INCISION AND DRAINAGE OF FACIAL ABSCESS AND EXTRACTION OF TOOTH #31;  Surgeon: Enis Slipper, DMD;  Location: [facility] OR;  Service: Dentistry;;   tosillectomy      Allergies:  Allergies  Allergen Reactions   Penicillins Hives and Rash    Did it involve swelling of the face/tongue/throat, SOB, or low BP? N  Did it involve sudden or  severe rash/hives, skin peeling, or any reaction on the inside of your mouth or nose? Y  Did you need to seek medical attention at a hospital or doctor's office? N  When did it last happen?  Several Years Ago      If all above answers are "NO", may proceed with cephalosporin use.  Did it involve swelling of the face/tongue/throat, SOB, or low BP? N Did it involve sudden or severe rash/hives, skin peeling, or any reaction on the inside of your mouth or nose? Y Did you need to seek medical attention at a hospital or doctor's office? N When did it last happen?  Several Years Ago     If all above answers are "NO", may proceed with cephalosporin use.    Family History:  Family History  Problem Relation Age of Onset   Lung cancer Mother    Hypertension Father    Hyperlipidemia Father    Cancer Father     Social History:  Social History   Tobacco Use   Smoking status: Former    Current packs/day: 0.00    Average packs/day: 3.0 packs/day for 25.0 years (75.0 ttl pk-yrs)    Types: Cigarettes    Start date: 82    Quit date: 2008    Years since quitting: 16.8   Smokeless tobacco: Never  Vaping Use   Vaping status: Never Used  Substance Use Topics   Alcohol use: Yes    Comment: Drinks alcohol  on a social basis.   Drug use: No    ROS: Constitutional:  Negative for fever, chills, weight loss CV: Negative for chest pain, previous MI, hypertension Respiratory:  Negative for shortness of breath, wheezing, sleep apnea, frequent cough GI:  Negative for nausea, vomiting, bloody stool, GERD  Physical exam: BP (!) 164/90   Pulse 74   Ht 6\' 2"  (1.88 m)   Wt 270 lb (122.5 kg)   BMI 34.67 kg/m  GENERAL APPEARANCE:  Well appearing, well developed, well nourished, NAD HEENT:  Atraumatic, normocephalic, oropharynx clear NECK:  Supple without lymphadenopathy or thyromegaly ABDOMEN:  Soft, non-tender, no masses EXTREMITIES:  Moves all extremities well, without clubbing, cyanosis, or  edema NEUROLOGIC:  Alert and oriented x 3, normal gait, CN II-XII grossly intact MENTAL STATUS:  appropriate BACK:  Non-tender to palpation, No CVAT SKIN:  Warm, dry, and intact   Results: U/A: 3-10 RBC, 11-30 WBC  TRANSRECTAL ULTRASOUND AND PROSTATE BIOPSY  Indication:  Elevated PSA  Prophylactic antibiotic administration: Rocephin  All medications that could result in increased bleeding were discontinued within an appropriate period of the time of biopsy.  Risk including bleeding and infection were discussed.  Informed consent was obtained.  The patient was placed in the left lateral decubitus position.  PROCEDURE 1.  TRANSRECTAL ULTRASOUND OF THE PROSTATE  The 7 MHz transrectal probe was used to image the prostate.  Anal stenosis was not noted.  TRUS volume: 110.5 ml  Hypoechoic areas: None  Hyperechoic areas: None  Central calcifications: not present  Margins:  normal  Seminal Vesicles: normal   PROCEDURE 2:  PROSTATE BIOPSY  A periprostatic block was performed using 1% lidocaine and transrectal ultrasound guidance. Under transrectal ultrasound guidance, and using the Biopty gun, prostate biopsies were obtained systematically from the apex, mid gland, and base bilaterally.  A total of 12 cores were obtained.  Hemostasis was obtained with gentle pressure on the prostate.  The procedures were well-tolerated.  No significant bleeding was noted at the end of the procedure.  The patient was stable for discharge from the office.

## 2022-11-07 NOTE — Progress Notes (Signed)
IM Injection  Patient is present today for an IM Injection for treatment of infection prevention post prostate biopsy Drug: Ceftriaxone Dose:1g Location:right upper outer buttocks Lot: [id number] Exp:07/2024 Patient tolerated well, no complications were noted  Performed by: Arville Go CMA

## 2022-11-07 NOTE — Addendum Note (Signed)
Addended by: Lizbeth Bark on: 11/07/2022 09:23 AM   Modules accepted: Orders

## 2022-11-12 ENCOUNTER — Telehealth: Payer: Self-pay | Admitting: Urology

## 2022-11-12 ENCOUNTER — Other Ambulatory Visit: Payer: Self-pay | Admitting: Urology

## 2022-11-12 ENCOUNTER — Encounter: Payer: Self-pay | Admitting: Urology

## 2022-11-12 DIAGNOSIS — N419 Inflammatory disease of prostate, unspecified: Secondary | ICD-10-CM

## 2022-11-12 MED ORDER — LEVOFLOXACIN 500 MG PO TABS: 500.0000 mg | ORAL_TABLET | Freq: Every day | ORAL | 0 refills | Status: AC

## 2022-11-12 NOTE — Telephone Encounter (Signed)
Prostate biopsy from 11/07/2022 reviewed.  The biopsy showed no evidence of malignancy, benign prostate tissue, and focal acute and chronic inflammation. I discussed these results with the patient by phone today. He is doing well following the biopsy.  No problems with bleeding or fever/chills. Given the findings of acute and chronic prostatitis on the biopsy, I recommended a 2-week course of antibiotics. Prescription for Levaquin 500 mg daily x 14 days sent to his pharmacy. Recommend follow-up in approximately 4 months for continued monitoring of his PSA.

## 2022-11-13 ENCOUNTER — Other Ambulatory Visit: Payer: Self-pay | Admitting: Family Medicine

## 2022-11-13 ENCOUNTER — Telehealth: Payer: Self-pay | Admitting: Urology

## 2022-11-13 DIAGNOSIS — I10 Essential (primary) hypertension: Secondary | ICD-10-CM

## 2022-11-13 NOTE — Telephone Encounter (Signed)
-----   Message from Di Kindle sent at 11/12/2022 11:05 PM EDT ----- Please cancel appt for 10/24. Please schedule f/u appt for 4 months.

## 2022-11-13 NOTE — Telephone Encounter (Signed)
Called pt to sch 4 month follow up. LVM.

## 2022-11-15 ENCOUNTER — Ambulatory Visit: Payer: PRIVATE HEALTH INSURANCE | Admitting: Urology

## 2022-12-11 ENCOUNTER — Telehealth: Payer: Self-pay | Admitting: *Deleted

## 2022-12-11 NOTE — Telephone Encounter (Signed)
Yesi,   This pt is a documented difficult intubation and his procedure will need to be done at the hospital.   Thanks,  Ronnette Rump 

## 2022-12-17 ENCOUNTER — Ambulatory Visit (AMBULATORY_SURGERY_CENTER): Payer: PRIVATE HEALTH INSURANCE

## 2022-12-17 ENCOUNTER — Telehealth: Payer: Self-pay

## 2022-12-17 VITALS — Ht 74.0 in | Wt 270.0 lb

## 2022-12-17 DIAGNOSIS — Z860100 Personal history of colon polyps, unspecified: Secondary | ICD-10-CM

## 2022-12-17 MED ORDER — NA SULFATE-K SULFATE-MG SULF 17.5-3.13-1.6 GM/177ML PO SOLN
1.0000 | Freq: Once | ORAL | 0 refills | Status: AC
Start: 2022-12-17 — End: 2022-12-17

## 2022-12-17 NOTE — Progress Notes (Signed)
No egg or soy allergy known to patient  No issues known to pt with past sedation with any surgeries or procedures Patient denies ever being told they had issues or difficulty with intubation  No FH of Malignant Hyperthermia Pt is not on diet pills Pt is not on  home 02  Pt is not on blood thinners  Pt denies issues with constipation  No A fib or A flutter Have any cardiac testing pending--no Pt instructed to use Single[employer] or GoodRx for a price reduction on prep  Ambulates independently

## 2022-12-17 NOTE — Telephone Encounter (Signed)
The patient is accepted to the [facility] GI service for treatment. Looks like he previously had a colonoscopy back in 2015 with multiple polyps removed was a large 1 reported to requiring 30 mm snare.  Pathology is not present.  However it is recommended a repeat colonoscopy which she had in 2017.  His bowel preparations have always been noted to be inadequate. Would plan for patient to be scheduled next available colonoscopy in the [facility] with me. Needs to be on 1 week of MiraLAX daily plus Dulcolax every other day before bowel preparation begins versus doing a 2-day bowel preparation. We will get him back on a regular scheduled colonoscopy interval thereafter, but presumably no more than every 5 years due to what was presumably an advanced adenoma years ago. Thanks. GM     October 25, 2022

## 2022-12-17 NOTE — Telephone Encounter (Signed)
Thank you for this update. Okay for patient to be scheduled in hospital-based setting due to issues indicated. I am okay for this to be a direct colonoscopy, if he wants to be seen in clinic he can be seen in clinic as well. Thanks. GM

## 2022-12-17 NOTE — Telephone Encounter (Signed)
John Nulty documented on 12/11/22 that patient is difficult intubation and needs to have his colonoscopy completed at the hospital.

## 2022-12-18 ENCOUNTER — Other Ambulatory Visit: Payer: Self-pay

## 2022-12-18 DIAGNOSIS — Z860100 Personal history of colon polyps, unspecified: Secondary | ICD-10-CM

## 2022-12-18 MED ORDER — NA SULFATE-K SULFATE-MG SULF 17.5-3.13-1.6 GM/177ML PO SOLN: 1.0000 | Freq: Once | ORAL | 0 refills | Status: AC

## 2022-12-18 NOTE — Telephone Encounter (Signed)
Colon has been set up for 02/07/23 at [facility] with GM at 1115 am  Left message on machine to call back

## 2022-12-19 NOTE — Telephone Encounter (Signed)
Colon scheduled, pt instructed and medications reviewed.  Patient instructions mailed to home.  Patient to call with any questions or concerns.  He will pick up the prep within the week.

## 2022-12-19 NOTE — Telephone Encounter (Signed)
Left message on machine to call back  

## 2023-01-15 ENCOUNTER — Encounter: Payer: PRIVATE HEALTH INSURANCE | Admitting: Gastroenterology

## 2023-01-29 ENCOUNTER — Encounter (HOSPITAL_COMMUNITY): Payer: Self-pay | Admitting: Gastroenterology

## 2023-02-06 ENCOUNTER — Encounter (HOSPITAL_COMMUNITY): Payer: Self-pay | Admitting: Gastroenterology

## 2023-02-06 NOTE — Anesthesia Preprocedure Evaluation (Addendum)
Anesthesia Evaluation  Patient identified by MRN, date of birth, ID band Patient awake    Reviewed: Allergy & Precautions, Patient's Chart, lab work & pertinent test results  History of Anesthesia Complications (+) PONV, DIFFICULT AIRWAY and history of anesthetic complications  Airway Mallampati: II  TM Distance: >3 FB Neck ROM: Full    Dental no notable dental hx. (+) Teeth Intact, Dental Advisory Given   Pulmonary former smoker   Pulmonary exam normal breath sounds clear to auscultation       Cardiovascular hypertension, Pt. on medications Normal cardiovascular exam Rhythm:Regular Rate:Normal     Neuro/Psych  Neuromuscular disease    GI/Hepatic Neg liver ROS,,,  Endo/Other  negative endocrine ROS    Renal/GU      Musculoskeletal   Abdominal   Peds  Hematology   Anesthesia Other Findings All:PCN  Reproductive/Obstetrics                              Anesthesia Physical Anesthesia Plan  ASA: 2  Anesthesia Plan: MAC   Post-op Pain Management: Minimal or no pain anticipated   Induction:   PONV Risk Score and Plan: Treatment may vary due to age or medical condition and Propofol infusion  Airway Management Planned: Natural Airway and Simple Face Mask  Additional Equipment: None  Intra-op Plan:   Post-operative Plan:   Informed Consent: I have reviewed the patients History and Physical, chart, labs and discussed the procedure including the risks, benefits and alternatives for the proposed anesthesia with the patient or authorized representative who has indicated his/her understanding and acceptance.     Dental advisory given  Plan Discussed with: CRNA and Anesthesiologist  Anesthesia Plan Comments: (Colonoscopy for colon polyps)         Anesthesia Quick Evaluation

## 2023-02-07 ENCOUNTER — Encounter (HOSPITAL_COMMUNITY): Payer: Self-pay | Admitting: Gastroenterology

## 2023-02-07 ENCOUNTER — Encounter (HOSPITAL_COMMUNITY)
Admission: RE | Disposition: A | Discharge status: Home / Self Care | Payer: Self-pay | Source: Home / Self Care | Attending: Gastroenterology

## 2023-02-07 ENCOUNTER — Ambulatory Visit (HOSPITAL_BASED_OUTPATIENT_CLINIC_OR_DEPARTMENT_OTHER): Payer: PRIVATE HEALTH INSURANCE | Admitting: Anesthesiology

## 2023-02-07 ENCOUNTER — Ambulatory Visit (HOSPITAL_COMMUNITY)
Admission: RE | Admit: 2023-02-07 | Discharge: 2023-02-07 | Disposition: A | Discharge status: Home / Self Care | Payer: PRIVATE HEALTH INSURANCE | Attending: Gastroenterology | Admitting: Gastroenterology

## 2023-02-07 ENCOUNTER — Ambulatory Visit (HOSPITAL_COMMUNITY): Payer: PRIVATE HEALTH INSURANCE | Admitting: Anesthesiology

## 2023-02-07 ENCOUNTER — Other Ambulatory Visit: Payer: Self-pay

## 2023-02-07 DIAGNOSIS — D127 Benign neoplasm of rectosigmoid junction: Secondary | ICD-10-CM | POA: Insufficient documentation

## 2023-02-07 DIAGNOSIS — D123 Benign neoplasm of transverse colon: Secondary | ICD-10-CM | POA: Diagnosis not present

## 2023-02-07 DIAGNOSIS — D124 Benign neoplasm of descending colon: Secondary | ICD-10-CM | POA: Insufficient documentation

## 2023-02-07 DIAGNOSIS — Z1211 Encounter for screening for malignant neoplasm of colon: Secondary | ICD-10-CM | POA: Insufficient documentation

## 2023-02-07 DIAGNOSIS — D125 Benign neoplasm of sigmoid colon: Secondary | ICD-10-CM | POA: Insufficient documentation

## 2023-02-07 DIAGNOSIS — D128 Benign neoplasm of rectum: Secondary | ICD-10-CM | POA: Insufficient documentation

## 2023-02-07 DIAGNOSIS — I10 Essential (primary) hypertension: Secondary | ICD-10-CM | POA: Diagnosis not present

## 2023-02-07 DIAGNOSIS — K644 Residual hemorrhoidal skin tags: Secondary | ICD-10-CM | POA: Insufficient documentation

## 2023-02-07 DIAGNOSIS — Z860100 Personal history of colon polyps, unspecified: Secondary | ICD-10-CM

## 2023-02-07 DIAGNOSIS — K635 Polyp of colon: Secondary | ICD-10-CM | POA: Insufficient documentation

## 2023-02-07 DIAGNOSIS — Z87891 Personal history of nicotine dependence: Secondary | ICD-10-CM | POA: Diagnosis not present

## 2023-02-07 DIAGNOSIS — K642 Third degree hemorrhoids: Secondary | ICD-10-CM | POA: Insufficient documentation

## 2023-02-07 DIAGNOSIS — K5730 Diverticulosis of large intestine without perforation or abscess without bleeding: Secondary | ICD-10-CM | POA: Insufficient documentation

## 2023-02-07 DIAGNOSIS — Z860101 Personal history of adenomatous and serrated colon polyps: Secondary | ICD-10-CM

## 2023-02-07 DIAGNOSIS — D122 Benign neoplasm of ascending colon: Secondary | ICD-10-CM | POA: Insufficient documentation

## 2023-02-07 HISTORY — PX: POLYPECTOMY: SHX5525

## 2023-02-07 HISTORY — PX: COLONOSCOPY WITH PROPOFOL: SHX5780

## 2023-02-07 SURGERY — COLONOSCOPY WITH PROPOFOL
Anesthesia: Monitor Anesthesia Care

## 2023-02-07 MED ORDER — PROPOFOL 500 MG/50ML IV EMUL
INTRAVENOUS | Status: DC | PRN
  Administered 2023-02-07: 125 ug/kg/min via INTRAVENOUS

## 2023-02-07 MED ORDER — ONDANSETRON HCL 4 MG/2ML IJ SOLN
INTRAMUSCULAR | Status: DC | PRN
  Administered 2023-02-07: 4 mg via INTRAVENOUS

## 2023-02-07 MED ORDER — SODIUM CHLORIDE 0.9 % IV SOLN: INTRAVENOUS | Status: DC | PRN

## 2023-02-07 MED ORDER — PROPOFOL 10 MG/ML IV BOLUS
INTRAVENOUS | Status: DC | PRN
Start: 2023-02-07 — End: 2023-02-07
  Administered 2023-02-07 (×4): 20 mg via INTRAVENOUS

## 2023-02-07 MED ORDER — HYDROCORTISONE ACETATE 25 MG RE SUPP: 25.0000 mg | Freq: Every evening | RECTAL | 1 refills | Status: DC

## 2023-02-07 SURGICAL SUPPLY — 21 items

## 2023-02-07 NOTE — H&P (Signed)
GASTROENTEROLOGY PROCEDURE H&P NOTE   Primary Care Physician: Sharlene Dory, DO  HPI: Tim Welch is a 58 y.o. male who presents for Colonoscopy for surveillance with history of previous advanced adenomas.  Past Medical History:  Diagnosis Date   Complication of anesthesia    Essential (primary) hypertension    Hyperlipidemia    Hypertension    Mixed hyperlipidemia    Neuralgia and neuritis, unspecified    PONV (postoperative nausea and vomiting)    Past Surgical History:  Procedure Laterality Date   CEREBRAL MICROVASCULAR DECOMPRESSION  09/20/2022   COLONOSCOPY     NOSE SURGERY  1994   TOOTH EXTRACTION  04/18/2020   Procedure: INCISION AND DRAINAGE OF FACIAL ABSCESS AND EXTRACTION OF TOOTH #31;  Surgeon: Enis Slipper, DMD;  Location: [facility] OR;  Service: Dentistry;;   tosillectomy     No current facility-administered medications for this encounter.   No current facility-administered medications for this encounter. Allergies  Allergen Reactions   Penicillins Hives and Rash    Did it involve swelling of the face/tongue/throat, SOB, or low BP? N  Did it involve sudden or severe rash/hives, skin peeling, or any reaction on the inside of your mouth or nose? Y  Did you need to seek medical attention at a hospital or doctor's office? N  When did it last happen?  Several Years Ago      If all above answers are "NO", may proceed with cephalosporin use.  Did it involve swelling of the face/tongue/throat, SOB, or low BP? N Did it involve sudden or severe rash/hives, skin peeling, or any reaction on the inside of your mouth or nose? Y Did you need to seek medical attention at a hospital or doctor's office? N When did it last happen?  Several Years Ago     If all above answers are "NO", may proceed with cephalosporin use.   Family History  Problem Relation Age of Onset   Esophageal cancer Mother    Lung cancer Mother    Hypertension Father    Hyperlipidemia  Father    Cancer Father    Colon cancer Neg Hx    Rectal cancer Neg Hx    Stomach cancer Neg Hx    Social History   Socioeconomic History   Marital status: Married    Spouse name: Not on file   Number of children: 3   Years of education: Not on file   Highest education level: Not on file  Occupational History   Not on file  Tobacco Use   Smoking status: Former    Current packs/day: 0.00    Average packs/day: 3.0 packs/day for 25.0 years (75.0 ttl pk-yrs)    Types: Cigarettes    Start date: 9    Quit date: 2008    Years since quitting: 17.0   Smokeless tobacco: Never  Vaping Use   Vaping status: Never Used  Substance and Sexual Activity   Alcohol use: Yes    Comment: Drinks alcohol on a social basis.   Drug use: No   Sexual activity: Yes  Other Topics Concern   Not on file  Social History Narrative   Not on file   Social Drivers of Health   Financial Resource Strain: Not on file  Food Insecurity: Low Risk  (09/20/2022)   Received from [facility]   Hunger Vital Sign    Worried About Running Out of Food in the Last Year: Never true    Ran  Out of Food in the Last Year: Never true  Transportation Needs: No Transportation Needs (09/20/2022)   Received from [facility]   Transportation    In the past 12 months, has lack of reliable transportation kept you from medical appointments, meetings, work or from getting things needed for daily living? : No  Physical Activity: Not on file  Stress: Not on file  Social Connections: Unknown (06/04/2021)   Received from [facility]   Social Network    Social Network: Not on file  Intimate Partner Violence: Unknown (04/26/2021)   Received from [facility]   HITS    Physically Hurt: Not on file    Insult or Talk Down To: Not on file    Threaten Physical Harm: Not on file    Scream or Curse: Not on file    Physical Exam: Today's Vitals   01/29/23 1154 02/07/23 0953  BP:  (!) 177/77   Pulse:  93  Resp:  19  Temp:  (!) 97.3 F (36.3 C)  TempSrc:  Temporal  SpO2:  98%  Weight: 122 kg 122.5 kg  Height:  6\' 2"  (1.88 m)  PainSc:  0-No pain   Body mass index is 34.67 kg/m. GEN: NAD EYE: Sclerae anicteric ENT: MMM CV: Non-tachycardic GI: Soft, NT/ND NEURO:  Alert & Oriented x 3  Lab Results: No results for input(s): "WBC", "HGB", "HCT", "PLT" in the last 72 hours. BMET No results for input(s): "NA", "K", "CL", "CO2", "GLUCOSE", "BUN", "CREATININE", "CALCIUM" in the last 72 hours. LFT No results for input(s): "PROT", "ALBUMIN", "AST", "ALT", "ALKPHOS", "BILITOT", "BILIDIR", "IBILI" in the last 72 hours. PT/INR No results for input(s): "LABPROT", "INR" in the last 72 hours.   Impression / Plan: This is a 58 y.o.male who presents for Colonoscopy for surveillance with history of previous advanced adenomas.  The risks and benefits of endoscopic evaluation/treatment were discussed with the patient and/or family; these include but are not limited to the risk of perforation, infection, bleeding, missed lesions, lack of diagnosis, severe illness requiring hospitalization, as well as anesthesia and sedation related illnesses.  The patient's history has been reviewed, patient examined, no change in status, and deemed stable for procedure.  The patient and/or family is agreeable to proceed.    Corliss Parish, MD [facility] Advanced [facility]oscopy Office # [phone]

## 2023-02-07 NOTE — Anesthesia Postprocedure Evaluation (Signed)
Anesthesia Post Note  Patient: Kimmy Pipitone Goeller  Procedure(s) Performed: COLONOSCOPY WITH PROPOFOL POLYPECTOMY     Patient location during evaluation: Endoscopy Anesthesia Type: MAC Level of consciousness: awake and alert Pain management: pain level controlled Vital Signs Assessment: post-procedure vital signs reviewed and stable Respiratory status: spontaneous breathing, nonlabored ventilation, respiratory function stable and patient connected to nasal cannula oxygen Cardiovascular status: blood pressure returned to baseline and stable Postop Assessment: no apparent nausea or vomiting Anesthetic complications: no   No notable events documented.  Last Vitals:  Vitals:   02/07/23 1230 02/07/23 1240  BP: 125/75 129/66  Pulse: 77 77  Resp: 18 17  Temp:    SpO2: 96% 98%    Last Pain:  Vitals:   02/07/23 1240  TempSrc:   PainSc: 0-No pain                 Trevor Iha

## 2023-02-07 NOTE — Transfer of Care (Signed)
Immediate Anesthesia Transfer of Care Note  Patient: Tim Welch  Procedure(s) Performed: Procedure(s): COLONOSCOPY WITH PROPOFOL (N/A) POLYPECTOMY  Patient Location: PACU  Anesthesia Type:MAC  Level of Consciousness:  sedated, patient cooperative and responds to stimulation  Airway & Oxygen Therapy:Patient Spontanous Breathing and Patient connected to face mask oxgen  Post-op Assessment:  Report given to PACU RN and Post -op Vital signs reviewed and stable  Post vital signs:  Reviewed and stable  Last Vitals:  Vitals:   02/07/23 0953 02/07/23 1216  BP: (!) 177/77 99/62  Pulse: 93 81  Resp: 19 14  Temp: (!) 36.3 C (!) 36.2 C  SpO2: 98% 96%    Complications: No apparent anesthesia complications

## 2023-02-07 NOTE — Anesthesia Procedure Notes (Signed)
Date/Time: 02/07/2023 11:29 AM  Performed by: Ezri Landers D, CRNAOxygen Delivery Method: Simple face mask

## 2023-02-07 NOTE — Op Note (Signed)
[facility] Patient Name: Tim Welch Procedure Date: 02/07/2023 MRN: [id number] Attending MD: Corliss Parish , MD, [id number] Date of Birth: 06/18/1965 CSN: [id number] Age: 58 Admit Type: Outpatient Procedure:                Colonoscopy Indications:              High risk colon cancer surveillance: Personal                            history of adenoma (10 mm or greater in size) Providers:                Corliss Parish, MD, Fransisca Connors, Alan Ripper, Technician Referring MD:              Medicines:                Monitored Anesthesia Care Complications:            No immediate complications. Estimated Blood Loss:     Estimated blood loss was minimal. Procedure:                Pre-Anesthesia Assessment:                           - Prior to the procedure, a History and Physical                            was performed, and patient medications and                            allergies were reviewed. The patient's tolerance of                            previous anesthesia was also reviewed. The risks                            and benefits of the procedure and the sedation                            options and risks were discussed with the patient.                            All questions were answered, and informed consent                            was obtained. Prior Anticoagulants: The patient has                            taken no anticoagulant or antiplatelet agents. ASA                            Grade Assessment: III - A patient with severe  systemic disease. After reviewing the risks and                            benefits, the patient was deemed in satisfactory                            condition to undergo the procedure.                           After obtaining informed consent, the colonoscope                            was passed under direct vision. Throughout the                             procedure, the patient's blood pressure, pulse, and                            oxygen saturations were monitored continuously. The                            CF-HQ190L ([id number]) Olympus colonoscope was                            introduced through the anus and advanced to the the                            cecum, identified by the ileocecal valve. The                            colonoscopy was performed without difficulty. The                            patient tolerated the procedure. The quality of the                            bowel preparation was adequate. The terminal ileum,                            ileocecal valve, appendiceal orifice, and rectum                            were photographed. Scope In: 11:41:30 AM Scope Out: 12:09:47 PM Scope Withdrawal Time: 0 hours 25 minutes 8 seconds  Total Procedure Duration: 0 hours 28 minutes 17 seconds  Findings:      The digital rectal exam findings include hemorrhoids. Pertinent       negatives include no palpable rectal lesions.      The terminal ileum and ileocecal valve appeared normal.      Nine sessile polyps were found in the rectum (1), recto-sigmoid colon       (2), sigmoid colon (2), descending colon (2), hepatic flexure (1) and       ascending colon (1). The polyps were 3 to 15 mm in size. These polyps       were  removed with a cold snare. Resection and retrieval were complete.      Multiple small-mouthed diverticula were found in the recto-sigmoid colon       and sigmoid colon.      Normal mucosa was found in the entire colon otherwise.      Non-bleeding non-thrombosed prolapsed external and internal hemorrhoids       were found during retroflexion, during perianal exam and during digital       exam. The hemorrhoids were Grade III (internal hemorrhoids that prolapse       but require manual reduction). Impression:               - Hemorrhoids found on digital rectal exam.                           - The examined portion of  the ileum was normal.                           - Nine, 3 to 15 mm polyps in the rectum, at the                            recto-sigmoid colon, in the sigmoid colon, in the                            descending colon, at the hepatic flexure and in the                            ascending colon, removed with a cold snare.                            Resected and retrieved.                           - Diverticulosis in the recto-sigmoid colon and in                            the sigmoid colon.                           - Normal mucosa in the entire examined colon                            otherwise.                           - Non-bleeding non-thrombosed prolapsed external                            and internal hemorrhoids. Moderate Sedation:      Not Applicable - Patient had care per Anesthesia. Recommendation:           - The patient will be observed post-procedure,                            until all discharge criteria are met.                           -  Discharge patient to home.                           - Patient has a contact number available for                            emergencies. The signs and symptoms of potential                            delayed complications were discussed with the                            patient. Return to normal activities tomorrow.                            Written discharge instructions were provided to the                            patient.                           - High fiber diet.                           - Use FiberCon 1-2 tablets PO daily.                           - Continue present medications.                           - Await pathology results.                           - Repeat colonoscopy in 3 years for surveillance.                           - The findings and recommendations were discussed                            with the patient.                           - The findings and recommendations were discussed                             with the patient's family. Procedure Code(s):        --- Professional ---                           [phone], Colonoscopy, flexible; with removal of                            tumor(s), polyp(s), or other lesion(s) by snare                            technique Diagnosis Code(s):        ---  Professional ---                           Z86.010, Personal history of colonic polyps                           K64.2, Third degree hemorrhoids                           D12.8, Benign neoplasm of rectum                           D12.7, Benign neoplasm of rectosigmoid junction                           D12.5, Benign neoplasm of sigmoid colon                           D12.4, Benign neoplasm of descending colon                           D12.3, Benign neoplasm of transverse colon (hepatic                            flexure or splenic flexure)                           D12.2, Benign neoplasm of ascending colon                           K57.30, Diverticulosis of large intestine without                            perforation or abscess without bleeding CPT copyright 2022 [employer]. All rights reserved. The codes documented in this report are preliminary and upon coder review may  be revised to meet current compliance requirements. Corliss Parish, MD 02/07/2023 12:18:45 PM Number of Addenda: 0

## 2023-02-07 NOTE — Discharge Instructions (Signed)

## 2023-02-08 ENCOUNTER — Encounter: Payer: Self-pay | Admitting: Gastroenterology

## 2023-02-08 LAB — SURGICAL PATHOLOGY

## 2023-02-10 ENCOUNTER — Encounter (HOSPITAL_COMMUNITY): Payer: Self-pay | Admitting: Gastroenterology

## 2023-02-12 ENCOUNTER — Other Ambulatory Visit: Payer: Self-pay | Admitting: Family Medicine

## 2023-02-12 DIAGNOSIS — I10 Essential (primary) hypertension: Secondary | ICD-10-CM

## 2023-04-09 ENCOUNTER — Other Ambulatory Visit: Payer: Self-pay | Admitting: Family Medicine

## 2023-04-10 ENCOUNTER — Other Ambulatory Visit: Payer: Self-pay | Admitting: Family Medicine

## 2023-05-15 ENCOUNTER — Other Ambulatory Visit: Payer: Self-pay | Admitting: Family Medicine

## 2023-05-15 DIAGNOSIS — I10 Essential (primary) hypertension: Secondary | ICD-10-CM

## 2023-05-25 NOTE — Progress Notes (Signed)
 History of Present Illness: 58 year old male returns for follow-up.  Previously seen by Dr. Willye Harvey.  He carries a diagnosis of elevated PSA.  He underwent ultrasound and biopsy of his prostate on November 07, 2022.  Prostate volume 111 mL, PSA 4.25, PSAD 0.04.Tim Welch  Ultrasound appearance of the prostate was otherwise normal.  All 12 cores taken were benign.  They did reveal focal chronic inflammation, 1 core revealed marked acute and chronic inflammation.  Today comes in for recent initial gross hematuria.  That was last week.  He was having some hematuria before that as well.  He has a prescription for Cipro  and started taking it 4 to 5 days ago.  At that time he was also having some discomfort with urination.  Some of that has improved.  No fever or chills.  No scrotal pain.  Past Medical History:  Diagnosis Date   Complication of anesthesia    Essential (primary) hypertension    Hyperlipidemia    Hypertension    Mixed hyperlipidemia    Neuralgia and neuritis, unspecified    PONV (postoperative nausea and vomiting)     Past Surgical History:  Procedure Laterality Date   CEREBRAL MICROVASCULAR DECOMPRESSION  09/20/2022   COLONOSCOPY     COLONOSCOPY WITH PROPOFOL  N/A 02/07/2023   Procedure: COLONOSCOPY WITH PROPOFOL ;  Surgeon: Brice Campi Albino Alu., MD;  Location: [facility];  Service: Gastroenterology;  Laterality: N/A;   NOSE SURGERY  1994   POLYPECTOMY  02/07/2023   Procedure: POLYPECTOMY;  Surgeon: Mansouraty, Albino Alu., MD;  Location: [facility];  Service: Gastroenterology;;   TOOTH EXTRACTION  04/18/2020   Procedure: INCISION AND DRAINAGE OF FACIAL ABSCESS AND EXTRACTION OF TOOTH #31;  Surgeon: Rafael Bun, DMD;  Location: [facility];  Service: Dentistry;;   tosillectomy      Home Medications:  Allergies as of 05/27/2023       Reactions   Penicillins Hives, Rash   Did it involve swelling of the face/tongue/throat, SOB, or low BP? N Did it involve sudden or severe  rash/hives, skin peeling, or any reaction on the inside of your mouth or nose? Y Did you need to seek medical attention at a hospital or doctor's office? N When did it last happen?  Several Years Ago     If all above answers are "NO", may proceed with cephalosporin use. Did it involve swelling of the face/tongue/throat, SOB, or low BP? N Did it involve sudden or severe rash/hives, skin peeling, or any reaction on the inside of your mouth or nose? Y Did you need to seek medical attention at a hospital or doctor's office? N When did it last happen?  Several Years Ago     If all above answers are "NO", may proceed with cephalosporin use.        Medication List        Accurate as of May 25, 2023  7:07 PM. If you have any questions, ask your nurse or doctor.          atorvastatin  20 MG tablet Commonly known as: LIPITOR TAKE 1 TABLET BY MOUTH EVERY DAY FOR CHOLESTEROL   cyanocobalamin  500 MCG tablet Commonly known as: VITAMIN B12 Take 500 mcg by mouth daily.   gabapentin  300 MG capsule Commonly known as: NEURONTIN  Take 2 tabs in the morning and afternoon, 3 tabs in the evening. What changed:  how much to take how to take this when to take this reasons to take this   glucosamine-chondroitin 500-400 MG tablet  Take 1 tablet by mouth 3 (three) times daily.   hydrochlorothiazide  25 MG tablet Commonly known as: HYDRODIURIL  TAKE 1 TABLET BY MOUTH ONCE DAILY   hydrocortisone  25 MG suppository Commonly known as: ANUSOL -HC Place 1 suppository (25 mg total) rectally at bedtime. Nightly x 1 week and then every other night   losartan  50 MG tablet Commonly known as: COZAAR  TAKE 1 TABLET BY MOUTH ONCE DAILY   sildenafil  20 MG tablet Commonly known as: REVATIO  1 tab as needed prior to sex        Allergies:  Allergies  Allergen Reactions   Penicillins Hives and Rash    Did it involve swelling of the face/tongue/throat, SOB, or low BP? N  Did it involve sudden or severe  rash/hives, skin peeling, or any reaction on the inside of your mouth or nose? Y  Did you need to seek medical attention at a hospital or doctor's office? N  When did it last happen?  Several Years Ago      If all above answers are "NO", may proceed with cephalosporin use.  Did it involve swelling of the face/tongue/throat, SOB, or low BP? N Did it involve sudden or severe rash/hives, skin peeling, or any reaction on the inside of your mouth or nose? Y Did you need to seek medical attention at a hospital or doctor's office? N When did it last happen?  Several Years Ago     If all above answers are "NO", may proceed with cephalosporin use.    Family History  Problem Relation Age of Onset   Esophageal cancer Mother    Lung cancer Mother    Hypertension Father    Hyperlipidemia Father    Cancer Father    Colon cancer Neg Hx    Rectal cancer Neg Hx    Stomach cancer Neg Hx     Social History:  reports that he quit smoking about 17 years ago. His smoking use included cigarettes. He started smoking about 42 years ago. He has a 75 pack-year smoking history. He has never used smokeless tobacco. He reports current alcohol use. He reports that he does not use drugs.  ROS: A complete review of systems was performed.  All systems are negative except for pertinent findings as noted.  Physical Exam:  Vital signs in last 24 hours: There were no vitals taken for this visit. Constitutional:  Alert and oriented, No acute distress Cardiovascular: Regular rate  Respiratory: Normal respiratory effort GI: Abdomen is soft, nontender, nondistended, no abdominal masses. No CVAT.  Genitourinary: Normal male phallus, testes are descended bilaterally and non-tender and without masses, scrotum is normal in appearance without lesions or masses, perineum is normal on inspection.  Rectal exam-external hemorrhoids present.  No rectal masses.  Prostate 100 g, symmetric, nonnodular and nontender. Lymphatic: No  lymphadenopathy Neurologic: Grossly intact, no focal deficits Psychiatric: Normal mood and affect  I have reviewed prior pt notes  I have reviewed urinalysis results--microscopic hematuria with a few bacteria, rare white cells seen  I have independently reviewed prior imaging--prostate ultrasound volume  I have reviewed prior PSA and pathology results     Impression/Assessment:  -Elevated PSA with negative biopsy in October, 2024  -BPH, large gland  -Possible prostatitis  -Gross/microscopic hematuria, initial in nature.  Not terribly worrisome at this point.  He has had longstanding microscopic hematuria with negative evaluations  Plan:  -I told him to stop the Cipro .  I will give him a prescription for Bactrim  -Does  need an appointment for follow-up PSA.  I will have him come back in about 3 months to see either me or Dr. Willye Harvey following PSA  -His urine was sent for culture today

## 2023-05-27 ENCOUNTER — Encounter: Payer: Self-pay | Admitting: Urology

## 2023-05-27 ENCOUNTER — Ambulatory Visit (INDEPENDENT_AMBULATORY_CARE_PROVIDER_SITE_OTHER): Payer: PRIVATE HEALTH INSURANCE | Admitting: Urology

## 2023-05-27 VITALS — BP 166/95 | HR 87 | Ht 74.0 in | Wt 270.0 lb

## 2023-05-27 DIAGNOSIS — R3129 Other microscopic hematuria: Secondary | ICD-10-CM | POA: Diagnosis not present

## 2023-05-27 DIAGNOSIS — R972 Elevated prostate specific antigen [PSA]: Secondary | ICD-10-CM | POA: Diagnosis not present

## 2023-05-27 DIAGNOSIS — R310 Gross hematuria: Secondary | ICD-10-CM | POA: Diagnosis not present

## 2023-05-27 DIAGNOSIS — N400 Enlarged prostate without lower urinary tract symptoms: Secondary | ICD-10-CM | POA: Diagnosis not present

## 2023-05-27 LAB — MICROSCOPIC EXAMINATION

## 2023-05-27 LAB — URINALYSIS, ROUTINE W REFLEX MICROSCOPIC
Bilirubin, UA: NEGATIVE
Glucose, UA: NEGATIVE
Ketones, UA: NEGATIVE
Leukocytes,UA: NEGATIVE
Nitrite, UA: NEGATIVE
Protein,UA: NEGATIVE
Specific Gravity, UA: >1.03 — ABNORMAL HIGH (ref 1.005–1.030)
Urobilinogen, Ur: 0.2 mg/dL (ref 0.2–1.0)
pH, UA: 5.5 (ref 5.0–7.5)

## 2023-05-27 MED ORDER — SULFAMETHOXAZOLE-TRIMETHOPRIM 800-160 MG PO TABS
1.0000 | ORAL_TABLET | Freq: Two times a day (BID) | ORAL | 2 refills | Status: DC
Start: 2023-05-27 — End: 2023-09-03

## 2023-06-10 ENCOUNTER — Ambulatory Visit: Payer: PRIVATE HEALTH INSURANCE | Admitting: Urology

## 2023-06-10 VITALS — BP 147/88 | HR 89 | Ht 74.0 in | Wt 270.0 lb

## 2023-06-10 DIAGNOSIS — N400 Enlarged prostate without lower urinary tract symptoms: Secondary | ICD-10-CM | POA: Diagnosis not present

## 2023-06-10 DIAGNOSIS — R310 Gross hematuria: Secondary | ICD-10-CM

## 2023-06-10 DIAGNOSIS — R972 Elevated prostate specific antigen [PSA]: Secondary | ICD-10-CM | POA: Diagnosis not present

## 2023-06-10 LAB — MICROSCOPIC EXAMINATION

## 2023-06-10 LAB — URINALYSIS, ROUTINE W REFLEX MICROSCOPIC
Bilirubin, UA: NEGATIVE
Glucose, UA: NEGATIVE
Ketones, UA: NEGATIVE
Nitrite, UA: NEGATIVE
Protein,UA: NEGATIVE
Specific Gravity, UA: 1.025 (ref 1.005–1.030)
Urobilinogen, Ur: 0.2 mg/dL (ref 0.2–1.0)
pH, UA: 5 (ref 5.0–7.5)

## 2023-06-10 MED ORDER — FINASTERIDE 5 MG PO TABS: 5.0000 mg | ORAL_TABLET | Freq: Every day | ORAL | 11 refills | Status: DC

## 2023-06-10 NOTE — Progress Notes (Signed)
 History of Present Illness: 5.5.2025: 58 year old male returns for follow-up/management of initial gross hematuria.  He was having symptomatic lower urinary tract symptoms at that time..  Previously seen by Dr. Willye Harvey.  He carries a diagnosis of elevated PSA.  He underwent ultrasound and biopsy of his prostate on November 07, 2022.  Prostate volume 111 mL, PSA 4.25, PSAD 0.04.Tim Welch  Ultrasound appearance of the prostate was otherwise normal.  All 12 cores taken were benign.  They did reveal focal chronic inflammation, 1 core revealed marked acute and chronic inflammation.  Today comes in for recent initial gross hematuria.  That was last week.  He was having some hematuria before that as well.  He had been on Cipro , this was changed to Bactrim .  Urinalysis at that time revealed 11-30 red cells per microscopic high-power field.  Urine culture was not sent.  5.19.2025: He comes in today because of recent initial gross hematuria starting again.  Associated with some discomfort at the base of his penis.  He started Bactrim  3 days ago, he has not seen blood for couple of days now.  Also denies dysuria. :  Past Medical History:  Diagnosis Date   Complication of anesthesia    Essential (primary) hypertension    Hyperlipidemia    Hypertension    Mixed hyperlipidemia    Neuralgia and neuritis, unspecified    PONV (postoperative nausea and vomiting)     Past Surgical History:  Procedure Laterality Date   CEREBRAL MICROVASCULAR DECOMPRESSION  09/20/2022   COLONOSCOPY     COLONOSCOPY WITH PROPOFOL  N/A 02/07/2023   Procedure: COLONOSCOPY WITH PROPOFOL ;  Surgeon: Mansouraty, Albino Alu., MD;  Location: [facility];  Service: Gastroenterology;  Laterality: N/A;   NOSE SURGERY  1994   POLYPECTOMY  02/07/2023   Procedure: POLYPECTOMY;  Surgeon: Mansouraty, Albino Alu., MD;  Location: [facility];  Service: Gastroenterology;;   TOOTH EXTRACTION  04/18/2020   Procedure: INCISION AND DRAINAGE OF FACIAL  ABSCESS AND EXTRACTION OF TOOTH #31;  Surgeon: Rafael Bun, DMD;  Location: [facility];  Service: Dentistry;;   tosillectomy      Home Medications:  Allergies as of 06/10/2023       Reactions   Penicillins Hives, Rash   Did it involve swelling of the face/tongue/throat, SOB, or low BP? N Did it involve sudden or severe rash/hives, skin peeling, or any reaction on the inside of your mouth or nose? Y Did you need to seek medical attention at a hospital or doctor's office? N When did it last happen?  Several Years Ago     If all above answers are "NO", may proceed with cephalosporin use. Did it involve swelling of the face/tongue/throat, SOB, or low BP? N Did it involve sudden or severe rash/hives, skin peeling, or any reaction on the inside of your mouth or nose? Y Did you need to seek medical attention at a hospital or doctor's office? N When did it last happen?  Several Years Ago     If all above answers are "NO", may proceed with cephalosporin use.        Medication List        Accurate as of Jun 10, 2023  1:16 PM. If you have any questions, ask your nurse or doctor.          atorvastatin  20 MG tablet Commonly known as: LIPITOR TAKE 1 TABLET BY MOUTH EVERY DAY FOR CHOLESTEROL   cyanocobalamin  500 MCG tablet Commonly known as: VITAMIN B12 Take 500 mcg  by mouth daily.   gabapentin  300 MG capsule Commonly known as: NEURONTIN  Take 2 tabs in the morning and afternoon, 3 tabs in the evening. What changed:  how much to take how to take this when to take this reasons to take this   glucosamine-chondroitin 500-400 MG tablet Take 1 tablet by mouth 3 (three) times daily.   hydrochlorothiazide  25 MG tablet Commonly known as: HYDRODIURIL  TAKE 1 TABLET BY MOUTH ONCE DAILY   hydrocortisone  25 MG suppository Commonly known as: ANUSOL -HC Place 1 suppository (25 mg total) rectally at bedtime. Nightly x 1 week and then every other night   losartan  50 MG tablet Commonly known  as: COZAAR  TAKE 1 TABLET BY MOUTH ONCE DAILY   sildenafil  20 MG tablet Commonly known as: REVATIO  1 tab as needed prior to sex   sulfamethoxazole -trimethoprim  800-160 MG tablet Commonly known as: BACTRIM  DS Take 1 tablet by mouth 2 (two) times daily.        Allergies:  Allergies  Allergen Reactions   Penicillins Hives and Rash    Did it involve swelling of the face/tongue/throat, SOB, or low BP? N  Did it involve sudden or severe rash/hives, skin peeling, or any reaction on the inside of your mouth or nose? Y  Did you need to seek medical attention at a hospital or doctor's office? N  When did it last happen?  Several Years Ago      If all above answers are "NO", may proceed with cephalosporin use.  Did it involve swelling of the face/tongue/throat, SOB, or low BP? N Did it involve sudden or severe rash/hives, skin peeling, or any reaction on the inside of your mouth or nose? Y Did you need to seek medical attention at a hospital or doctor's office? N When did it last happen?  Several Years Ago     If all above answers are "NO", may proceed with cephalosporin use.    Family History  Problem Relation Age of Onset   Esophageal cancer Mother    Lung cancer Mother    Hypertension Father    Hyperlipidemia Father    Cancer Father    Colon cancer Neg Hx    Rectal cancer Neg Hx    Stomach cancer Neg Hx     Social History:  reports that he quit smoking about 17 years ago. His smoking use included cigarettes. He started smoking about 42 years ago. He has a 75 pack-year smoking history. He has never used smokeless tobacco. He reports current alcohol use. He reports that he does not use drugs.  ROS: A complete review of systems was performed.  All systems are negative except for pertinent findings as noted.  Cystoscopy Procedure Note:  Indication: Gross hematuria  After informed consent and discussion of the procedure and its risks, Tim Welch was positioned and prepped  in the standard fashion.  Cystoscopy was performed with a flexible cystoscope.   Findings: Urethra: No stricture or lesion Prostate: Bilobar hypertrophy, elongated prostatic urethra with prominent vasculature Bladder neck: Open Ureteral orifices: Normal bilaterally Bladder: No lesions or foreign bodies.  Minimal trabeculations  The patient tolerated the procedure well.    Physical Exam:  Vital signs in last 24 hours: There were no vitals taken for this visit. Constitutional:  Alert and oriented, No acute distress Cardiovascular: Regular rate  Respiratory: Normal respiratory effort Neurologic: Grossly intact, no focal deficits Psychiatric: Normal mood and affect  I have reviewed prior pt notes  I have reviewed urinalysis  results--microscopic hematuria with a few bacteria, rare white cells seen  I have independently reviewed prior imaging--prostate ultrasound volume  I have reviewed prior PSA and pathology results     Impression/Assessment:  -Elevated PSA with negative biopsy in October, 2024  -BPH, large gland  -Possible prostatitis  -Gross/microscopic hematuria, initial in nature.  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  I sent a prescription  -I will see back in a couple of months for check

## 2023-07-01 ENCOUNTER — Ambulatory Visit: Payer: PRIVATE HEALTH INSURANCE | Admitting: Urology

## 2023-07-29 ENCOUNTER — Other Ambulatory Visit: Payer: Self-pay | Admitting: Family Medicine

## 2023-07-30 ENCOUNTER — Other Ambulatory Visit: Payer: Self-pay | Admitting: Family Medicine

## 2023-07-30 DIAGNOSIS — I10 Essential (primary) hypertension: Secondary | ICD-10-CM

## 2023-08-19 ENCOUNTER — Other Ambulatory Visit: Payer: PRIVATE HEALTH INSURANCE

## 2023-08-19 DIAGNOSIS — R972 Elevated prostate specific antigen [PSA]: Secondary | ICD-10-CM

## 2023-08-20 LAB — PSA: Prostate Specific Ag, Serum: 6.3 ng/mL — ABNORMAL HIGH (ref 0.0–4.0)

## 2023-08-26 ENCOUNTER — Ambulatory Visit (INDEPENDENT_AMBULATORY_CARE_PROVIDER_SITE_OTHER): Payer: PRIVATE HEALTH INSURANCE | Admitting: Urology

## 2023-08-26 VITALS — BP 163/83 | HR 84 | Ht 74.0 in | Wt 270.0 lb

## 2023-08-26 DIAGNOSIS — R310 Gross hematuria: Secondary | ICD-10-CM

## 2023-08-26 DIAGNOSIS — R972 Elevated prostate specific antigen [PSA]: Secondary | ICD-10-CM

## 2023-08-26 DIAGNOSIS — R3129 Other microscopic hematuria: Secondary | ICD-10-CM | POA: Diagnosis not present

## 2023-08-26 DIAGNOSIS — N419 Inflammatory disease of prostate, unspecified: Secondary | ICD-10-CM

## 2023-08-26 DIAGNOSIS — N400 Enlarged prostate without lower urinary tract symptoms: Secondary | ICD-10-CM | POA: Diagnosis not present

## 2023-08-26 DIAGNOSIS — Z8042 Family history of malignant neoplasm of prostate: Secondary | ICD-10-CM

## 2023-08-26 LAB — URINALYSIS, ROUTINE W REFLEX MICROSCOPIC
Bilirubin, UA: NEGATIVE
Glucose, UA: NEGATIVE
Ketones, UA: NEGATIVE
Leukocytes,UA: NEGATIVE
Nitrite, UA: NEGATIVE
Protein,UA: NEGATIVE
Specific Gravity, UA: 1.025 (ref 1.005–1.030)
Urobilinogen, Ur: 0.2 mg/dL (ref 0.2–1.0)
pH, UA: 5.5 (ref 5.0–7.5)

## 2023-08-26 LAB — MICROSCOPIC EXAMINATION

## 2023-08-26 NOTE — Progress Notes (Signed)
 Impression/Assessment:  -Elevated PSA with negative biopsy in October, 2024.  PSA recently 6.3.  Very low PSA density, however.  -BPH, large gland  -Possible prostatitis  -Gross/microscopic hematuria, initial in nature.  Recurrent, most likely from prostatic urethral source.  Cystoscopy in May of this year revealed no bladder abnormalities.  Plan:  - I will see him back in 6 months for recheck  -I will check PSA before that time  - We discussed medical management of his BPH.  Currently we will hold started on any medical therapy   History of Present Illness: 5.5.2025: 58 year old male returns for follow-up/management of initial gross hematuria.  He was having symptomatic lower urinary tract symptoms at that time.  Previously seen by Dr. Roseann.  He carries a diagnosis of elevated PSA.  He underwent ultrasound and biopsy of his prostate on November 07, 2022.  Prostate volume 111 mL, PSA 4.25, PSAD 0.04.SABRA  Ultrasound appearance of the prostate was otherwise normal.  All 12 cores taken were benign.  They did reveal focal chronic inflammation, 1 core revealed marked acute and chronic inflammation.  Today comes in for recent initial gross hematuria.  That was last week.  He was having some hematuria before that as well.  He had been on Cipro , this was changed to Bactrim .  Urinalysis at that time revealed 11-30 red cells per microscopic high-power field.  Urine culture was not sent.  5.19.2025: He comes in today because of recent initial gross hematuria starting again.  Associated with some discomfort at the base of his penis.  He started Bactrim  3 days ago, he has not seen blood for couple of days now.  Also denies dysuria. : 8.4.2025: Here today for recheck.  Recent PSA 6.3.  He is having no symptoms of urinary tract action.  He has had no blood in his urine. Past Medical History:  Diagnosis Date   Complication of anesthesia    Essential (primary) hypertension    Hyperlipidemia     Hypertension    Mixed hyperlipidemia    Neuralgia and neuritis, unspecified    PONV (postoperative nausea and vomiting)     Past Surgical History:  Procedure Laterality Date   CEREBRAL MICROVASCULAR DECOMPRESSION  09/20/2022   COLONOSCOPY     COLONOSCOPY WITH PROPOFOL  N/A 02/07/2023   Procedure: COLONOSCOPY WITH PROPOFOL ;  Surgeon: Wilhelmenia Aloha Raddle., MD;  Location: [facility];  Service: Gastroenterology;  Laterality: N/A;   NOSE SURGERY  1994   POLYPECTOMY  02/07/2023   Procedure: POLYPECTOMY;  Surgeon: Mansouraty, Aloha Raddle., MD;  Location: [facility];  Service: Gastroenterology;;   TOOTH EXTRACTION  04/18/2020   Procedure: INCISION AND DRAINAGE OF FACIAL ABSCESS AND EXTRACTION OF TOOTH #31;  Surgeon: Helga Cameron PARAS, DMD;  Location: [facility];  Service: Dentistry;;   tosillectomy      Home Medications:  Allergies as of 08/26/2023       Reactions   Penicillins Hives, Rash   Did it involve swelling of the face/tongue/throat, SOB, or low BP? N Did it involve sudden or severe rash/hives, skin peeling, or any reaction on the inside of your mouth or nose? Y Did you need to seek medical attention at a hospital or doctor's office? N When did it last happen?  Several Years Ago     If all above answers are "NO", may proceed with cephalosporin use. Did it involve swelling of the face/tongue/throat, SOB, or low BP? N Did it involve sudden or severe rash/hives, skin peeling, or any reaction  on the inside of your mouth or nose? Y Did you need to seek medical attention at a hospital or doctor's office? N When did it last happen?  Several Years Ago     If all above answers are "NO", may proceed with cephalosporin use.        Medication List        Accurate as of August 26, 2023  6:28 AM. If you have any questions, ask your nurse or doctor.          atorvastatin  20 MG tablet Commonly known as: LIPITOR TAKE 1 TABLET BY MOUTH EVERY DAY FOR CHOLESTEROL   cyanocobalamin  500 MCG  tablet Commonly known as: VITAMIN B12 Take 500 mcg by mouth daily.   finasteride  5 MG tablet Commonly known as: PROSCAR  Take 1 tablet (5 mg total) by mouth daily.   gabapentin  300 MG capsule Commonly known as: NEURONTIN  Take 2 tabs in the morning and afternoon, 3 tabs in the evening. What changed:  how much to take how to take this when to take this reasons to take this   glucosamine-chondroitin 500-400 MG tablet Take 1 tablet by mouth 3 (three) times daily.   hydrochlorothiazide  25 MG tablet Commonly known as: HYDRODIURIL  TAKE 1 TABLET BY MOUTH ONCE DAILY   hydrocortisone  25 MG suppository Commonly known as: ANUSOL -HC Place 1 suppository (25 mg total) rectally at bedtime. Nightly x 1 week and then every other night   losartan  50 MG tablet Commonly known as: COZAAR  Take 1 tablet (50 mg total) by mouth daily. Needs appt   sildenafil  20 MG tablet Commonly known as: REVATIO  1 tab as needed prior to sex   sulfamethoxazole -trimethoprim  800-160 MG tablet Commonly known as: BACTRIM  DS Take 1 tablet by mouth 2 (two) times daily.        Allergies:  Allergies  Allergen Reactions   Penicillins Hives and Rash    Did it involve swelling of the face/tongue/throat, SOB, or low BP? N  Did it involve sudden or severe rash/hives, skin peeling, or any reaction on the inside of your mouth or nose? Y  Did you need to seek medical attention at a hospital or doctor's office? N  When did it last happen?  Several Years Ago      If all above answers are "NO", may proceed with cephalosporin use.  Did it involve swelling of the face/tongue/throat, SOB, or low BP? N Did it involve sudden or severe rash/hives, skin peeling, or any reaction on the inside of your mouth or nose? Y Did you need to seek medical attention at a hospital or doctor's office? N When did it last happen?  Several Years Ago     If all above answers are "NO", may proceed with cephalosporin use.    Family History   Problem Relation Age of Onset   Esophageal cancer Mother    Lung cancer Mother    Hypertension Father    Hyperlipidemia Father    Cancer Father    Colon cancer Neg Hx    Rectal cancer Neg Hx    Stomach cancer Neg Hx     Social History:  reports that he quit smoking about 17 years ago. His smoking use included cigarettes. He started smoking about 42 years ago. He has a 75 pack-year smoking history. He has never used smokeless tobacco. He reports current alcohol use. He reports that he does not use drugs.  ROS: A complete review of systems was performed.  All systems are  negative except for pertinent findings as noted.  Cystoscopy Procedure Note:  Indication: Gross hematuria  After informed consent and discussion of the procedure and its risks, Tim Welch was positioned and prepped in the standard fashion.  Cystoscopy was performed with a flexible cystoscope.   Findings: Urethra: No stricture or lesion Prostate: Bilobar hypertrophy, elongated prostatic urethra with prominent vasculature Bladder neck: Open Ureteral orifices: Normal bilaterally Bladder: No lesions or foreign bodies.  Minimal trabeculations  The patient tolerated the procedure well.    Physical Exam:  Vital signs in last 24 hours: There were no vitals taken for this visit. Constitutional:  Alert and oriented, No acute distress Cardiovascular: Regular rate  Respiratory: Normal respiratory effort Neurologic: Grossly intact, no focal deficits Psychiatric: Normal mood and affect  I have reviewed prior pt notes

## 2023-09-03 ENCOUNTER — Other Ambulatory Visit: Payer: Self-pay | Admitting: Urology

## 2023-09-03 DIAGNOSIS — R310 Gross hematuria: Secondary | ICD-10-CM

## 2023-09-10 ENCOUNTER — Other Ambulatory Visit: Payer: Self-pay | Admitting: Family Medicine

## 2023-09-10 DIAGNOSIS — I10 Essential (primary) hypertension: Secondary | ICD-10-CM

## 2023-11-18 ENCOUNTER — Other Ambulatory Visit: Payer: Self-pay

## 2023-11-18 ENCOUNTER — Ambulatory Visit: Payer: PRIVATE HEALTH INSURANCE | Admitting: Physician Assistant

## 2023-11-18 ENCOUNTER — Encounter: Payer: Self-pay | Admitting: Physician Assistant

## 2023-11-18 DIAGNOSIS — S82831A Other fracture of upper and lower end of right fibula, initial encounter for closed fracture: Secondary | ICD-10-CM

## 2023-11-18 DIAGNOSIS — M25571 Pain in right ankle and joints of right foot: Secondary | ICD-10-CM

## 2023-11-18 NOTE — Progress Notes (Signed)
 HPI: Tim Welch 58 year old male comes in today with a right ankle pain.  He had an injury while setting up an RV at [employer] on 10/27/2023.  Twisted the right ankle.  Reports a history of his right ankle sustaining a fracture at the age of 37.  He has been offloading the ankle with a scooter and also the use of a crutch.  States most of the pain is lateral aspect of the leg radiates up the lower leg.  Notes some swelling.  He has tried a compression sleeve and a brace.  Feels that overall his pain is improving.  He has been taking ibuprofen for the pain.  No other injuries.  ROS: See HPI  Physical exam: General Well-developed well-nourished male no acute distress ambulates with crutch on his left side. Psych: Alert and oriented x 3 Respirations: Unlabored Vascular: Dorsal pedal pulses 2+ right calf supple nontender.  Right ankle diminished dorsiflexion plantarflexion compared to the left.  Has tenderness over the distal one fourth of the right fibular shaft no tenderness over the tibia.  Achilles is intact nontender.  Nontender over the calcaneus.  There is no rashes skin lesions ulcerations or ecchymosis of the right ankle and right lower leg.  Slight edema of the right lower leg compared to the left.  Nontender over the proximal tib-fib on the right.  Good range of motion of the right knee without pain.  Radiographs: Right ankle 3 views shows a Weber C minimally displaced transverse fracture.  Talus well located  without diastases.  Overall well-preserved.  No acute fractures involving the right tibia.  Impression: 3 weeks status post right ankle injury Right ankle Weber C minimally displaced fracture.  Plan will place him in a cam walker boot weight-bear as tolerated.  He will follow-up with us  in 4 weeks we will obtain 3 views of the right ankle at that time.  Questions were encouraged and answered.  Encouraged elevation wiggling toes.

## 2023-11-25 ENCOUNTER — Encounter: Payer: Self-pay | Admitting: Radiology

## 2023-12-16 ENCOUNTER — Encounter: Payer: Self-pay | Admitting: Physician Assistant

## 2023-12-16 ENCOUNTER — Ambulatory Visit: Payer: PRIVATE HEALTH INSURANCE | Admitting: Physician Assistant

## 2023-12-16 ENCOUNTER — Other Ambulatory Visit (INDEPENDENT_AMBULATORY_CARE_PROVIDER_SITE_OTHER): Payer: PRIVATE HEALTH INSURANCE

## 2023-12-16 DIAGNOSIS — M25571 Pain in right ankle and joints of right foot: Secondary | ICD-10-CM | POA: Diagnosis not present

## 2023-12-16 NOTE — Progress Notes (Signed)
 HPI: Emmanual returns today now 7 weeks 1 day status post right ankle injury in which he sustained a Weber C right ankle fracture.  He has been out of [employer].  He states overall he is doing well.  Still having tenderness over the lateral aspect of the right fibula.  No new injuries.  He is weightbearing in the cam walker boot without any assistive device.  Physical exam: General Well-developed well-nourished male no acute distress. Vascular: Right calf supple nontender dorsal pedal pulses 2+. Right ankle with good range of motion with dorsiflexion plantarflexion.  Tenderness over the distal fibular shaft.  Palpable callus.  Radiographs: 3 views right ankle: Weber C fracture remains nondisplaced.  Talus is well located within the ankle mortise.  No diastases.  Early signs of consolidation are seen at the fracture site.  Impression: Right ankle fracture  Plan: He will continue the cam walker boot.  Continue to work on range of motion of the ankle.  Will see him back in approximately 5 weeks at that time we will obtain 3 views of the right ankle at that time.  Questions were encouraged and answered at length.

## 2023-12-23 ENCOUNTER — Other Ambulatory Visit: Payer: Self-pay | Admitting: Family Medicine

## 2024-01-20 ENCOUNTER — Ambulatory Visit: Payer: PRIVATE HEALTH INSURANCE | Admitting: Physician Assistant

## 2024-02-12 ENCOUNTER — Other Ambulatory Visit: Payer: Self-pay | Admitting: Family Medicine

## 2024-02-26 ENCOUNTER — Ambulatory Visit: Payer: PRIVATE HEALTH INSURANCE | Admitting: Urology
